# Patient Record
Sex: Female | Born: 1960 | Race: Black or African American | Hispanic: No | Marital: Single | Smoking: Never smoker
Health system: Southern US, Community
[De-identification: ages and names within clinical notes are randomized; demographics above are authoritative.]

## PROBLEM LIST (undated history)

## (undated) DIAGNOSIS — K219 Gastro-esophageal reflux disease without esophagitis: Secondary | ICD-10-CM

## (undated) DIAGNOSIS — Z862 Personal history of diseases of the blood and blood-forming organs and certain disorders involving the immune mechanism: Secondary | ICD-10-CM

---

## 1999-09-07 HISTORY — PX: ABDOMINAL HYSTERECTOMY: SHX81

## 2011-01-12 ENCOUNTER — Inpatient Hospital Stay (INDEPENDENT_AMBULATORY_CARE_PROVIDER_SITE_OTHER)
Admission: RE | Admit: 2011-01-12 | Discharge: 2011-01-12 | Disposition: A | Discharge status: Home / Self Care | Payer: Self-pay | Source: Ambulatory Visit | Attending: Family Medicine | Admitting: Family Medicine

## 2011-01-12 ENCOUNTER — Inpatient Hospital Stay (HOSPITAL_COMMUNITY)
Admission: EM | Admit: 2011-01-12 | Discharge: 2011-01-13 | DRG: 313 | Disposition: A | Discharge status: Home / Self Care | Payer: Non-veteran care | Attending: Internal Medicine | Admitting: Internal Medicine

## 2011-01-12 ENCOUNTER — Emergency Department (HOSPITAL_COMMUNITY): Payer: Non-veteran care

## 2011-01-12 DIAGNOSIS — I10 Essential (primary) hypertension: Secondary | ICD-10-CM | POA: Diagnosis present

## 2011-01-12 DIAGNOSIS — K219 Gastro-esophageal reflux disease without esophagitis: Secondary | ICD-10-CM | POA: Diagnosis present

## 2011-01-12 DIAGNOSIS — D869 Sarcoidosis, unspecified: Secondary | ICD-10-CM | POA: Diagnosis present

## 2011-01-12 DIAGNOSIS — M940 Chondrocostal junction syndrome [Tietze]: Secondary | ICD-10-CM | POA: Diagnosis present

## 2011-01-12 DIAGNOSIS — J45909 Unspecified asthma, uncomplicated: Secondary | ICD-10-CM | POA: Diagnosis present

## 2011-01-12 DIAGNOSIS — J9819 Other pulmonary collapse: Secondary | ICD-10-CM | POA: Diagnosis not present

## 2011-01-12 DIAGNOSIS — R0789 Other chest pain: Principal | ICD-10-CM | POA: Diagnosis present

## 2011-01-12 DIAGNOSIS — R079 Chest pain, unspecified: Secondary | ICD-10-CM

## 2011-01-12 DIAGNOSIS — Z79899 Other long term (current) drug therapy: Secondary | ICD-10-CM

## 2011-01-12 DIAGNOSIS — D649 Anemia, unspecified: Secondary | ICD-10-CM | POA: Diagnosis present

## 2011-01-12 HISTORY — DX: Gastro-esophageal reflux disease without esophagitis: K21.9

## 2011-01-12 HISTORY — DX: Personal history of diseases of the blood and blood-forming organs and certain disorders involving the immune mechanism: Z86.2

## 2011-01-12 LAB — CBC
Hemoglobin: 11.3 g/dL — ABNORMAL LOW (ref 12.0–15.0)
MCHC: 34.2 g/dL (ref 30.0–36.0)
RBC: 3.84 MIL/uL — ABNORMAL LOW (ref 3.87–5.11)
WBC: 5.6 10*3/uL (ref 4.0–10.5)

## 2011-01-12 LAB — POCT I-STAT, CHEM 8
HCT: 34 % — ABNORMAL LOW (ref 36.0–46.0)
Hemoglobin: 11.6 g/dL — ABNORMAL LOW (ref 12.0–15.0)
Potassium: 4 mEq/L (ref 3.5–5.1)
Sodium: 141 mEq/L (ref 135–145)

## 2011-01-12 LAB — POCT I-STAT TROPONIN I: Troponin i, poc: 0 ng/mL (ref 0.00–0.08)

## 2011-01-12 LAB — PHOSPHORUS: Phosphorus: 3 mg/dL (ref 2.3–4.6)

## 2011-01-12 LAB — MAGNESIUM: Magnesium: 2.1 mg/dL (ref 1.5–2.5)

## 2011-01-12 LAB — TROPONIN I: Troponin I: <0.3 ng/mL (ref <0.30)

## 2011-01-13 ENCOUNTER — Other Ambulatory Visit (HOSPITAL_COMMUNITY): Payer: Self-pay

## 2011-01-13 ENCOUNTER — Inpatient Hospital Stay (HOSPITAL_COMMUNITY): Payer: Non-veteran care

## 2011-01-13 ENCOUNTER — Encounter (HOSPITAL_COMMUNITY): Payer: Self-pay

## 2011-01-13 DIAGNOSIS — R079 Chest pain, unspecified: Secondary | ICD-10-CM

## 2011-01-13 LAB — CBC
MCH: 29.4 pg (ref 26.0–34.0)
Platelets: 202 10*3/uL (ref 150–400)
RBC: 3.54 MIL/uL — ABNORMAL LOW (ref 3.87–5.11)

## 2011-01-13 LAB — COMPREHENSIVE METABOLIC PANEL
ALT: 8 U/L (ref 0–35)
AST: 12 U/L (ref 0–37)
Albumin: 3.6 g/dL (ref 3.5–5.2)
CO2: 25 mEq/L (ref 19–32)
Calcium: 8.9 mg/dL (ref 8.4–10.5)
GFR calc non Af Amer: >60 mL/min (ref >60)
Sodium: 141 mEq/L (ref 135–145)
Total Protein: 6.9 g/dL (ref 6.0–8.3)

## 2011-01-13 LAB — LIPID PANEL
HDL: 46 mg/dL (ref >39)
LDL Cholesterol: 101 mg/dL — ABNORMAL HIGH (ref 0–99)
Total CHOL/HDL Ratio: 3.4 RATIO
VLDL: 10 mg/dL (ref 0–40)

## 2011-01-13 LAB — CARDIAC PANEL(CRET KIN+CKTOT+MB+TROPI)
Relative Index: 2.2 (ref 0.0–2.5)
Total CK: 105 U/L (ref 7–177)

## 2011-01-13 LAB — TSH
TSH: 0.676 u[IU]/mL (ref 0.350–4.500)
TSH: 0.58 u[IU]/mL (ref 0.350–4.500)

## 2011-01-13 LAB — VITAMIN B12: Vitamin B-12: 298 pg/mL (ref 211–911)

## 2011-01-13 LAB — HEMOGLOBIN A1C: Hgb A1c MFr Bld: 6.3 % — ABNORMAL HIGH (ref <5.7)

## 2011-01-13 LAB — SEDIMENTATION RATE: Sed Rate: 17 mm/hr (ref 0–22)

## 2011-01-13 LAB — IRON AND TIBC: UIBC: 208 ug/dL

## 2011-01-13 MED ORDER — IOHEXOL 300 MG/ML  SOLN
100.0000 mL | Freq: Once | INTRAMUSCULAR | Status: AC | PRN
  Administered 2011-01-13: 100 mL via INTRAVENOUS

## 2011-01-14 NOTE — Discharge Summary (Signed)
  NAMEWALESKA, BUTTERY NO.:  [id number]  MEDICAL RECORD NO.:  [id number]  LOCATION:  2919                         FACILITY:  [facility]  PHYSICIAN:  Conley Canal, MD      DATE OF BIRTH:  02-26-1961  DATE OF ADMISSION:  01/12/2011 DATE OF DISCHARGE:  01/13/2011                        DISCHARGE SUMMARY - REFERRING   CONSULTING PHYSICIANS:  Verne Carrow, MD with [facility]  PRIMARY CARE PHYSICIAN:  [facility] [place]  DISCHARGE DIAGNOSES: 1. Chest pain likely gastroesophageal reflux disease versus acute     costochondritis. 2. Dependent atelectasis. 3. Sarcoidosis. 4. Gastroesophageal reflux disease. 5. History of bronchial asthma. 6. Essential hypertension, stable. 7. Normocytic anemia requiring further workup.  We would defer workup     to the patient's primary care provider.  DISCHARGE MEDICATIONS: 1. Albuterol inhalations two puffs q.4 hourly as needed. 2. Artificial tears 1 drop in both eyes twice daily. 3. Black cohosh 1 capsule twice daily. 4. Chlorhexidine oral rinse as needed. 5. DHEA OTC 1 capsule daily. 6. Diclofenac 75 mg twice daily as needed. 7. Benadryl 25 mg at bedtime as needed. 8. Felodipine 5 mg daily. 9. Loratadine 10 mg daily. 10.Prilosec 20 mg daily. 11.Soy isoflavone OTC 1 capsule twice daily.  PROCEDURES PERFORMED: 1. CT of the chest without contrast on January 13, 2011 showed no     evidence of PE, but showed some dependent atelectasis and     borderline bilateral axillary lymph node. 2. Ultrasound of abdomen on January 13, 2011 showed small hemangioma     within the left hepatic lobe.  No evidence of cholelithiasis or     cholecystitis. 3. 2-D echocardiogram on January 13, 2011 showed EF of 55% to 60% with     normal wall motion and trivial mitral regurgitation.  HOSPITAL COURSE:  Ms. Taitt is a pleasant 50 year old female admitted on January 12, 2011 with complaints of chest pain which was worse with exertion.  There was  concern for acute coronary syndrome.  However, serial cardiac enzymes were negative x3 and 2-D echocardiogram was unrevealing.  She had a CT of the chest which did not show PE, but some small bilateral axillary lymph node.  The patient was seen by Cardiology, Dr. Clifton James who felt that the patient could have stress test in the outpatient setting as he felt the pain was noncardiac in origin.  The patient is now chest pain free, likely she had gastroesophageal reflux disease and may need EGD if stress test is negative.  She should follow with her primary care provider at the [facility] for further workup or she could have [facility] perform the stress test.  Essential hypertension, stable and normocytic anemia requiring further workup.  We would defer workup to the patient's primary care provider.  The time spent for this discharge preparation is less than 30 minutes.     Conley Canal, MD     SR/MEDQ  D:  01/13/2011  T:  01/13/2011  Job:  [id number]  cc:   Verne Carrow, MD  Electronically Signed by Conley Canal  on 01/14/2011 10:12:05 PM

## 2011-02-09 NOTE — H&P (Signed)
[name], [name] NO.:  [id number]  MEDICAL RECORD NO.:  [id number]  LOCATION:  [facility]                         FACILITY:  [facility]  PHYSICIAN:  Richarda Overlie, MD       DATE OF BIRTH:  1960/07/05  DATE OF ADMISSION:  01/12/2011 DATE OF DISCHARGE:                             HISTORY & PHYSICAL   PRIMARY CARE PHYSICIAN:  Unassigned.  CHIEF COMPLAINT:  Chest pain.  SUBJECTIVE:  A 50 year old female who presents to the ER with a chief complaint of substernal chest pain.  The patient has had these symptoms for about a week and a half associated with nausea, shortness of breath, and dizziness, mostly experiencing when she is riding her bike.  The patient states that she has a history of sarcoidosis and has experienced similar chest pain, but the pain was always nonradiating and limited to her chest.  She also has a pleuritic component to the pain and the pain is worse with inspiration and when she is lying down.  The patient has also experienced radiation of the pain into the left upper extremity. She has also experienced some left leg pain for the last 1-1/2 weeks. She denies any fever, chills, rigors, or cough.  She denies any recent history of travel, any sick contact.  She is a nonsmoker.  There is no history of DVT or PE.  Denies any palpitations, any syncopal or near- syncopal episodes.  PAST MEDICAL HISTORY:  History of sarcoidosis, GERD, asthma.  PAST SURGICAL HISTORY:  None.  SOCIAL HISTORY:  No history of drug abuse.  No history of smoking or alcohol use.  FAMILY HISTORY:  Father has had an angioplasty done and also has had a stroke in 12-Nov-2004.  Mother died of lupus in 11/12/01.  HOME MEDICATIONS:  DHEA, black cohosh, soy isoflavone, diclofenac, chlorhexidine, albuterol, omeprazole, loratadine, and felodipine.  ALLERGIES:  To NORVASC, CODEINE, and SULFA.  REVIEW OF SYSTEMS:  Complete review of systems was done as documented in HPI.  PHYSICAL  EXAMINATION:  VITAL SIGNS:  Blood pressure 113/79, pulse of 65, respirations 15, temperature 99.1. GENERAL:  Comfortable, in no acute cardiopulmonary distress. HEENT:  Pupils equal and reactive.  Extraocular movements intact. NECK:  Supple.  No JVD. LUNGS:  Clear to auscultation bilaterally.  No wheezes, crackles, or rhonchi. CARDIOVASCULAR:  Regular rate and rhythm.  No murmurs, rubs, or gallops. ABDOMEN:  Obese, soft, nontender, nondistended. EXTREMITIES:  Without cyanosis, clubbing, or edema. NEUROLOGIC:  Cranial nerves II through XII grossly intact. PSYCHIATRIC:  Appropriate mood and affect.  LABORATORY DATA:  EKG shows normal sinus rhythm.  Normal ST-T wave.  WBC 5.6, hemoglobin 11.3, hematocrit 33.0, and a platelet count of 198. Sodium 141, potassium 4.0, chloride 106, BUN 16, creatinine 0.8, glucose 93.  Chest x-ray negative.  ASSESSMENT AND PLAN:  A 50 year old female with sort of atypical symptoms of chest pain.  The patient is fairly active and exercises 3-4 days a week with no associated symptoms, otherwise, but she has started experiencing this for the last 1-1/2 weeks.  Because of history of sarcoidosis and a family history of lupus, I think it is imperative to rule  out a pulmonary embolism.  We will obtain a D-dimer.  If positive, we will do a CT angio of the chest to rule out PE.  We are going to cycle cardiac enzymes, maintain her on telemetry, obtain a 2D echo to rule out wall motion abnormalities.  We will also do a right upper quadrant ultrasound since she does have some right upper quadrant pain associated with some nausea, keep her n.p.o. past midnight for the ultrasound.  She is a full code.  If the patient's workup is essentially negative, then we could consider doing Lexiscan Myoview prior to her discharge.     Richarda Overlie, MD     NA/MEDQ  D:  01/12/2011  T:  01/12/2011  Job:  [id number]  Electronically Signed by Richarda Overlie MD on 02/09/2011 07:30:41  PM

## 2011-02-21 NOTE — Consult Note (Signed)
NAMEMarland Tanner  LARAY, RIVKIN.:  [id number]  MEDICAL RECORD NO.:  [id number]  LOCATION:                                 FACILITY:  PHYSICIAN:  Marca Ancona, MD      DATE OF BIRTH:  1960/11/18  DATE OF CONSULTATION: DATE OF DISCHARGE:                                CONSULTATION   PRIMARY CARE PHYSICIAN:  [place], [place].  HISTORY OF PRESENT ILLNESS:  This is a 50 year old with history of sarcoidosis and chronic chest pain who presented to the hospital with chest pain.  The patient has had chest pain for years.  She takes diclofenac and has had TENS unit.  In the past, the pain has been constant (waxing/waning) and on the left side.  Over the last 1-2 weeks, the chest pain seems to have migrated to her central chest which is unusual for her.  It is still essentially constant.  It still waxes and wanes.  Her chest wall is tender.  The pain is not pleuritic.  The pain is not exertional.  The patient has noted some exertional dyspnea.  She has been mildly short of breath when riding her bicycle.  In general, she is active.  She came to the Urgent Care here at [facility] because of the different location of the chest pain and she was kept in the hospital.  So far, here her cardiac enzymes has been negative x3.  Her EKG is normal and her echo is normal.  MEDICATIONS: 1. Aspirin 81 mg daily. 2. Lovenox 40 mg daily. 3. Felodipine 5 mg daily. 4. Protonix 40 mg daily.  PAST MEDICAL HISTORY: 1. Gastroesophageal reflux disease. 2. Asthma. 3. Sarcoidosis with chronic chest pain. 4. Hypertension. 5. Echocardiogram done this hospitalization shows EF 55-60%, normal     diastolic parameters, and trivial mitral regurgitation.  SOCIAL HISTORY:  The patient is married.  She lives in [place].  She does not smoke, drink alcohol, or use any drugs.  FAMILY HISTORY:  Mother had lupus.  Father had coronary artery disease and has had a stroke.  REVIEW OF SYSTEMS:  All systems  were reviewed and were negative except as noted in the history of present illness.  PHYSICAL EXAMINATION:  VITAL SIGNS:  Temperature 98.3, pulse is in the 70s and regular, blood pressure 136/64, and oxygen saturation 99% on room air. GENERAL:  This is a well-developed female in no apparent distress. NEUROLOGIC:  Alert and oriented x3.  Normal affect. HEENT:  Normal. ABDOMEN:  Soft and nontender.  No hepatosplenomegaly.  Normal bowel sounds. NECK:  There is no thyromegaly or thyroid nodule.  There is no JVD. CARDIOVASCULAR:  Heart regular S1 and S2.  No S3, no S4.  There is no murmur.  There is no carotid bruit.  There is no peripheral edema. LUNGS:  Clear to auscultation bilaterally with normal respiratory effort. MUSCULOSKELETAL:  The chest wall is tender to palpation. SKIN:  Normal exam.  RADIOLOGY:  CT angiogram of the chest shows no PE.  EKG shows normal sinus rhythm, it is normal EKG.  LABORATORY DATA:  Hematocrit 30.4, potassium 3.5, creatinine 0.68.  Pro- BNP is  10.2.  TSH is normal.  D-dimer is negative.  Cardiac enzymes negative x3.  Sed rate is 17.  IMPRESSION:  This is a 50 year old with history of sarcoidosis with chronic chest pain who presented with chest pain with different features compared to her prior chronic chest pain.  Her chest wall is tender. Her cardiac enzymes are negative.  Her echo is normal.  Her EKG is normal.  CTA of the chest showed no PE.  I do suspect that her chest pain is noncardiac.  I think it is reasonable to get an outpatient stress test.  She can do this at the [place] or we can arrange for one.     Marca Ancona, MD     DM/MEDQ  D:  01/13/2011  T:  01/14/2011  Job:  [id number]  Electronically Signed by Marca Ancona MD on 02/21/2011 10:38:55 PM

## 2012-03-15 ENCOUNTER — Emergency Department (HOSPITAL_COMMUNITY): Payer: Non-veteran care

## 2012-03-15 ENCOUNTER — Emergency Department (HOSPITAL_COMMUNITY)
Admission: EM | Admit: 2012-03-15 | Discharge: 2012-03-15 | Disposition: A | Discharge status: Home / Self Care | Payer: Non-veteran care | Attending: Emergency Medicine | Admitting: Emergency Medicine

## 2012-03-15 ENCOUNTER — Encounter (HOSPITAL_COMMUNITY): Payer: Self-pay | Admitting: Physical Medicine and Rehabilitation

## 2012-03-15 DIAGNOSIS — J45909 Unspecified asthma, uncomplicated: Secondary | ICD-10-CM | POA: Insufficient documentation

## 2012-03-15 DIAGNOSIS — R0789 Other chest pain: Secondary | ICD-10-CM | POA: Insufficient documentation

## 2012-03-15 DIAGNOSIS — R350 Frequency of micturition: Secondary | ICD-10-CM | POA: Insufficient documentation

## 2012-03-15 DIAGNOSIS — K219 Gastro-esophageal reflux disease without esophagitis: Secondary | ICD-10-CM | POA: Insufficient documentation

## 2012-03-15 LAB — POCT I-STAT TROPONIN I

## 2012-03-15 LAB — CBC
HCT: 36.6 % (ref 36.0–46.0)
Hemoglobin: 12.2 g/dL (ref 12.0–15.0)
MCV: 86.5 fL (ref 78.0–100.0)
RBC: 4.23 MIL/uL (ref 3.87–5.11)
WBC: 5 10*3/uL (ref 4.0–10.5)

## 2012-03-15 LAB — POCT I-STAT, CHEM 8
Chloride: 106 mEq/L (ref 96–112)
Creatinine, Ser: 0.9 mg/dL (ref 0.50–1.10)
Glucose, Bld: 100 mg/dL — ABNORMAL HIGH (ref 70–99)
Hemoglobin: 12.9 g/dL (ref 12.0–15.0)
Potassium: 3.9 mEq/L (ref 3.5–5.1)
Sodium: 142 mEq/L (ref 135–145)

## 2012-03-15 MED ORDER — HYDROCODONE-ACETAMINOPHEN 5-325 MG PO TABS
1.0000 | ORAL_TABLET | Freq: Once | ORAL | Status: AC
  Administered 2012-03-15: 1 via ORAL
  Filled 2012-03-15: qty 1

## 2012-03-15 MED ORDER — HYDROCODONE-ACETAMINOPHEN 5-325 MG PO TABS: 1.0000 | ORAL_TABLET | ORAL | Status: DC | PRN

## 2012-03-15 MED ORDER — NITROGLYCERIN 0.4 MG SL SUBL
0.4000 mg | SUBLINGUAL_TABLET | SUBLINGUAL | Status: DC | PRN
  Administered 2012-03-15: 0.4 mg via SUBLINGUAL

## 2012-03-15 MED ORDER — ASPIRIN 325 MG PO TABS: 325.0000 mg | ORAL_TABLET | ORAL | Status: DC

## 2012-03-15 MED ORDER — LABETALOL HCL 5 MG/ML IV SOLN: 20.0000 mg | Freq: Once | INTRAVENOUS | Status: DC

## 2012-03-15 NOTE — ED Provider Notes (Cosign Needed)
History     CSN: [id number]  Arrival date & time 03/15/12  4098   First MD Initiated Contact with Patient 03/15/12 [phone]      Chief Complaint  Patient presents with  . Chest Pain    (Consider location/radiation/quality/duration/timing/severity/associated sxs/prior treatment) HPI Comments: Is a 51 year old woman who complains of chest pain. She has a history of sarcoidosis and chronic chest pain. This morning around 7 the pain started, and was felt behind her left breast and radiated through to the back. She rated the pain as an 8. She took aspirin 81 mg without relief. She therefore sought evaluation, because the pain was worse than usual. She's had prior evaluation for chest pain in August of 2012, and her pain was felt to be noncardiac at that time. She has subsequently been treated for her pain at the [facility] by a pain medicine doctor who has given her injections for thoracic chest pain.  Patient is a 51 y.o. female presenting with chest pain. The history is provided by the patient and medical records. No language interpreter was used.  Chest Pain The chest pain began less than 1 hour ago. Duration of episode(s) is 1 hour. Chest pain occurs constantly. The chest pain is unchanged. The pain is associated with breathing. At its most intense, the pain is at 8/10. The pain is currently at 8/10. The severity of the pain is moderate. The quality of the pain is described as pleuritic and aching. The pain radiates to the upper back. Chest pain is worsened by deep breathing. Primary symptoms include cough. Pertinent negatives for primary symptoms include no fever, no shortness of breath, no palpitations, no nausea and no altered mental status. She tried aspirin for the symptoms. Risk factors include no known risk factors (Prior Hx sarcoidosis and of noncardiac chest pain.).  Her past medical history is significant for hypertension. Past medical history comments: Sarcoidosis, depression     Past  Medical History  Diagnosis Date  . Asthma   . History of sarcoidosis t  . GERD (gastroesophageal reflux disease) t    No past surgical history on file.  No family history on file.  History  Substance Use Topics  . Smoking status: Current Every Day Smoker    Types: Cigarettes  . Smokeless tobacco: Not on file  . Alcohol Use: No    OB History    Grav Para Term Preterm Abortions TAB SAB Ect Mult Living                  Review of Systems  Constitutional: Negative for fever.       Subjective fever in past 2 weeks.  HENT: Negative.   Eyes: Negative.   Respiratory: Positive for cough. Negative for shortness of breath.   Cardiovascular: Positive for chest pain. Negative for palpitations.  Gastrointestinal: Negative.  Negative for nausea.  Genitourinary: Positive for frequency.  Skin: Negative.   Neurological: Negative.   Psychiatric/Behavioral: Negative.  Negative for altered mental status.    Allergies  Norvasc; Sulfa antibiotics; and Codeine  Home Medications  No current outpatient prescriptions on file.  BP 122/76  Pulse 86  Temp 98.5 F (36.9 C) (Oral)  Resp 22  SpO2 100%  Physical Exam  Nursing note and vitals reviewed. Constitutional: She is oriented to person, place, and time. She appears well-developed and well-nourished.       Mild distress with left chest pain.  HENT:  Head: Normocephalic and atraumatic.  Right Ear: External ear  normal.  Left Ear: External ear normal.  Mouth/Throat: Oropharynx is clear and moist.       Braces on teeth.  Eyes: Conjunctivae normal and EOM are normal. Pupils are equal, round, and reactive to light.  Neck: Normal range of motion. Neck supple.  Cardiovascular: Normal rate, regular rhythm and normal heart sounds.   Pulmonary/Chest: Effort normal and breath sounds normal.       No friction rub, but pt notes pain worse when takes a deep breath.  Abdominal: Soft. Bowel sounds are normal.  Musculoskeletal: Normal range of  motion.  Neurological: She is alert and oriented to person, place, and time.       No sensory or motor deficit.  Skin: Skin is warm and dry.  Psychiatric: She has a normal mood and affect. Her behavior is normal.    ED Course  Procedures (including critical care time)  8:13 AM  Date: 03/15/2012  Rate: 96  Rhythm: normal sinus rhythm  QRS Axis: normal  Intervals: normal  ST/T Wave abnormalities: nonspecific T wave changes  Conduction Disutrbances:none  Narrative Interpretation: Abnormal EKG  Old EKG Reviewed: changes noted--has nonspecific T wave changes, not present on EKG of 01/13/2011.  8:55 AM Pt seen --> physical exam performed.  Lab workup ordered.  PO hydrocodone-acetaminophen for pain.  11:56 AM Results for orders placed during the hospital encounter of 03/15/12  CBC      Component Value Range   WBC 5.0  4.0 - 10.5 K/uL   RBC 4.23  3.87 - 5.11 MIL/uL   Hemoglobin 12.2  12.0 - 15.0 g/dL   HCT 16.1  09.6 - 04.5 %   MCV 86.5  78.0 - 100.0 fL   MCH 28.8  26.0 - 34.0 pg   MCHC 33.3  30.0 - 36.0 g/dL   RDW 40.9  81.1 - 91.4 %   Platelets 237  150 - 400 K/uL  D-DIMER, QUANTITATIVE      Component Value Range   D-Dimer, Quant <0.27  0.00 - 0.48 ug/mL-FEU  POCT I-STAT, CHEM 8      Component Value Range   Sodium 142  135 - 145 mEq/L   Potassium 3.9  3.5 - 5.1 mEq/L   Chloride 106  96 - 112 mEq/L   BUN 14  6 - 23 mg/dL   Creatinine, Ser 7.82  0.50 - 1.10 mg/dL   Glucose, Bld 956 (*) 70 - 99 mg/dL   Calcium, Ion 2.13  0.86 - 1.23 mmol/L   TCO2 24  0 - 100 mmol/L   Hemoglobin 12.9  12.0 - 15.0 g/dL   HCT 57.8  46.9 - 62.9 %  POCT I-STAT TROPONIN I      Component Value Range   Troponin i, poc 0.00  0.00 - 0.08 ng/mL   Comment 3            Dg Chest Port 1 View  03/15/2012  *RADIOLOGY REPORT*  Clinical Data: Chest pain, history hypertension, sarcoidosis  PORTABLE CHEST - 1 VIEW  Comparison: Portable exam 0859 hours compared to 01/12/2011  Findings: Normal heart size,  mediastinal contours, and pulmonary vascularity. Lungs clear. No pleural effusion or pneumothorax. Cardiac monitoring leads project over chest.  IMPRESSION: No acute abnormalities.   Original Report Authenticated By: Lollie Marrow, M.D.     Lab workup is negative.  No serious cause of chest pain was found.  Pt released, with prescription for hydrocodone-acetaminophen q4h prn pain. F/U with [facility].  May need  additional injection for her chronic, recurrent chest pain.  1. Atypical chest pain          Carleene Cooper III, MD 03/15/12 1159

## 2012-03-15 NOTE — ED Notes (Signed)
Pt presents to department for evaluation of L sided chest pain. Ongoing for several years, states pain became worse x2 weeks ago. 9/10 pain at the time, increases with movement and deep breathing. Respirations unlabored. Skin warm and dry. No signs of acute distress noted at present.

## 2012-05-06 ENCOUNTER — Encounter (HOSPITAL_BASED_OUTPATIENT_CLINIC_OR_DEPARTMENT_OTHER): Payer: Non-veteran care

## 2012-05-09 ENCOUNTER — Ambulatory Visit (HOSPITAL_BASED_OUTPATIENT_CLINIC_OR_DEPARTMENT_OTHER): Payer: Non-veteran care | Attending: Internal Medicine

## 2012-05-09 VITALS — Ht 66.5 in | Wt 170.0 lb

## 2012-05-09 DIAGNOSIS — R259 Unspecified abnormal involuntary movements: Secondary | ICD-10-CM | POA: Insufficient documentation

## 2012-05-09 DIAGNOSIS — G4733 Obstructive sleep apnea (adult) (pediatric): Secondary | ICD-10-CM

## 2012-05-09 DIAGNOSIS — G4730 Sleep apnea, unspecified: Secondary | ICD-10-CM | POA: Insufficient documentation

## 2012-05-09 DIAGNOSIS — G4710 Hypersomnia, unspecified: Secondary | ICD-10-CM | POA: Insufficient documentation

## 2012-05-14 DIAGNOSIS — G4710 Hypersomnia, unspecified: Secondary | ICD-10-CM

## 2012-05-14 DIAGNOSIS — R0989 Other specified symptoms and signs involving the circulatory and respiratory systems: Secondary | ICD-10-CM

## 2012-05-14 DIAGNOSIS — M62838 Other muscle spasm: Secondary | ICD-10-CM

## 2012-05-14 DIAGNOSIS — G4730 Sleep apnea, unspecified: Secondary | ICD-10-CM

## 2012-05-14 DIAGNOSIS — R0609 Other forms of dyspnea: Secondary | ICD-10-CM

## 2012-05-14 NOTE — Procedures (Signed)
NAMEJAPJI, KOK NO.:  [id number]  MEDICAL RECORD NO.:  [id number]          PATIENT TYPE:  OUT  LOCATION:  SLEEP CENTER                 FACILITY[facility]  [facility]  PHYSICIAN:  Karolyne Timmons D. Maple Hudson, MD, FCCP, FACPDATE OF BIRTH:  14-Jun-1960  DATE OF STUDY:  05/09/2012                           NOCTURNAL POLYSOMNOGRAM  REFERRING PHYSICIAN:  Caris Cerveny D. Maple Hudson, MD, FCCP, FACP  REFERRING PHYSICIAN:  Dr. Verlin Fester.  INDICATION FOR STUDY:  Hypersomnia with sleep apnea.  EPWORTH SLEEPINESS SCORE:  16/24.  BMI 27, weight 170 pounds, height 66.5 inches, neck 14.5 inches.  MEDICATIONS:  Home medications are charted and reviewed.  SLEEP ARCHITECTURE:  Total sleep time 306.5 minutes with sleep efficiency 84.4%.  Stage I was 9.8%, stage II 74.6%.  Stage III absent, REM 15.7% of total sleep time.  Sleep latency 32.5 minutes.  REM latency 106.5 minutes.  Awake after sleep onset 23 minutes.  Arousal index 9.8.  RESPIRATORY DATA:  Apnea/hypopnea index (AHI) 1.8 per hour.  A total of 9 events was scored, all was hypopneas while supine.  REM AHI 7.5 per hour.  There were insufficient numbers of events to permit application of split protocol, CPAP titration on this study night.  OXYGEN DATA:  Moderate snoring with oxygen desaturation to a nadir of 92% and mean oxygen saturation through the study of 96.2% on room air.  CARDIAC DATA:  Normal sinus rhythm.  MOVEMENT/PARASOMNIA:  A total of 117 limb jerks were counted, of which 5 were associated with arousal or awakening for periodic limb movement with arousal index of 1 per hour.  No bathroom trips.  IMPRESSION/RECOMMENDATION: 1. Unremarkable sleep architecture for sleep center environment     without bedtime medication.  Several brief spontaneous wakings were     noted. 2. Occasional respiratory event with sleep disturbance, within normal     limits.  AHI 1.8 per hour (the normal range for adults is from 0-5     events per  hour).  Moderate snoring with oxygen desaturation to a     nadir of 92% and mean oxygen saturation through the study of 96.2%     on room air. 3. Frequent limb jerks.  A total of 117 limb jerks were counted, of     which 5 were associated with arousal or     awakening for periodic limb movement with arousal index of 1 per     hour which is of doubtful clinical significance.     Erle Guster D. Maple Hudson, MD, [facility], FACP Diplomate, American Board of Sleep Medicine    CDY/MEDQ  D:  05/14/2012 11:47:46  T:  05/14/2012 17:48:51  Job:  [id number]

## 2015-11-11 ENCOUNTER — Encounter (HOSPITAL_COMMUNITY): Payer: Self-pay | Admitting: Emergency Medicine

## 2015-11-11 ENCOUNTER — Emergency Department (HOSPITAL_COMMUNITY): Payer: Non-veteran care

## 2015-11-11 ENCOUNTER — Emergency Department (HOSPITAL_COMMUNITY)
Admission: EM | Admit: 2015-11-11 | Discharge: 2015-11-11 | Disposition: A | Discharge status: Home / Self Care | Payer: Non-veteran care | Attending: Emergency Medicine | Admitting: Emergency Medicine

## 2015-11-11 DIAGNOSIS — F17210 Nicotine dependence, cigarettes, uncomplicated: Secondary | ICD-10-CM | POA: Diagnosis not present

## 2015-11-11 DIAGNOSIS — J45909 Unspecified asthma, uncomplicated: Secondary | ICD-10-CM | POA: Insufficient documentation

## 2015-11-11 DIAGNOSIS — Z862 Personal history of diseases of the blood and blood-forming organs and certain disorders involving the immune mechanism: Secondary | ICD-10-CM | POA: Insufficient documentation

## 2015-11-11 DIAGNOSIS — Z79899 Other long term (current) drug therapy: Secondary | ICD-10-CM | POA: Insufficient documentation

## 2015-11-11 DIAGNOSIS — R079 Chest pain, unspecified: Secondary | ICD-10-CM | POA: Insufficient documentation

## 2015-11-11 DIAGNOSIS — K219 Gastro-esophageal reflux disease without esophagitis: Secondary | ICD-10-CM | POA: Diagnosis not present

## 2015-11-11 LAB — COMPREHENSIVE METABOLIC PANEL
ALT: 17 U/L (ref 14–54)
ANION GAP: 4 — AB (ref 5–15)
AST: 23 U/L (ref 15–41)
Albumin: 4.1 g/dL (ref 3.5–5.0)
Alkaline Phosphatase: 90 U/L (ref 38–126)
BUN: 16 mg/dL (ref 6–20)
CHLORIDE: 111 mmol/L (ref 101–111)
CO2: 21 mmol/L — ABNORMAL LOW (ref 22–32)
Calcium: 9.6 mg/dL (ref 8.9–10.3)
Creatinine, Ser: 0.67 mg/dL (ref 0.44–1.00)
GFR calc Af Amer: >60 mL/min (ref >60)
Glucose, Bld: 115 mg/dL — ABNORMAL HIGH (ref 65–99)
POTASSIUM: 4.3 mmol/L (ref 3.5–5.1)
Sodium: 136 mmol/L (ref 135–145)
Total Bilirubin: 0.3 mg/dL (ref 0.3–1.2)
Total Protein: 7.2 g/dL (ref 6.5–8.1)

## 2015-11-11 LAB — URINALYSIS, ROUTINE W REFLEX MICROSCOPIC
Bilirubin Urine: NEGATIVE
Glucose, UA: NEGATIVE mg/dL
HGB URINE DIPSTICK: NEGATIVE
Ketones, ur: NEGATIVE mg/dL
LEUKOCYTES UA: NEGATIVE
Nitrite: NEGATIVE
Protein, ur: NEGATIVE mg/dL
SPECIFIC GRAVITY, URINE: 1.019 (ref 1.005–1.030)
pH: 5.5 (ref 5.0–8.0)

## 2015-11-11 LAB — CBC
HCT: 35.3 % — ABNORMAL LOW (ref 36.0–46.0)
HEMOGLOBIN: 11.4 g/dL — AB (ref 12.0–15.0)
MCH: 27.7 pg (ref 26.0–34.0)
MCHC: 32.3 g/dL (ref 30.0–36.0)
MCV: 85.9 fL (ref 78.0–100.0)
Platelets: 231 10*3/uL (ref 150–400)
RBC: 4.11 MIL/uL (ref 3.87–5.11)
RDW: 13.7 % (ref 11.5–15.5)
WBC: 4.4 10*3/uL (ref 4.0–10.5)

## 2015-11-11 LAB — I-STAT TROPONIN, ED: TROPONIN I, POC: 0 ng/mL (ref 0.00–0.08)

## 2015-11-11 MED ORDER — IOPAMIDOL (ISOVUE-370) INJECTION 76%
INTRAVENOUS | Status: AC
  Administered 2015-11-11: 100 mL
  Filled 2015-11-11: qty 100

## 2015-11-11 MED ORDER — GI COCKTAIL ~~LOC~~
30.0000 mL | Freq: Once | ORAL | Status: AC
  Administered 2015-11-11: 30 mL via ORAL
  Filled 2015-11-11: qty 30

## 2015-11-11 NOTE — Discharge Instructions (Signed)
Nonspecific Chest Pain  °Chest pain can be caused by many different conditions. There is always a chance that your pain could be related to something serious, such as a heart attack or a blood clot in your lungs. Chest pain can also be caused by conditions that are not life-threatening. If you have chest pain, it is very important to follow up with your health care provider. °CAUSES  °Chest pain can be caused by: °· Heartburn. °· Pneumonia or bronchitis. °· Anxiety or stress. °· Inflammation around your heart (pericarditis) or lung (pleuritis or pleurisy). °· A blood clot in your lung. °· A collapsed lung (pneumothorax). It can develop suddenly on its own (spontaneous pneumothorax) or from trauma to the chest. °· Shingles infection (varicella-zoster virus). °· Heart attack. °· Damage to the bones, muscles, and cartilage that make up your chest wall. This can include: °¨ Bruised bones due to injury. °¨ Strained muscles or cartilage due to frequent or repeated coughing or overwork. °¨ Fracture to one or more ribs. °¨ Sore cartilage due to inflammation (costochondritis). °RISK FACTORS  °Risk factors for chest pain may include: °· Activities that increase your risk for trauma or injury to your chest. °· Respiratory infections or conditions that cause frequent coughing. °· Medical conditions or overeating that can cause heartburn. °· Heart disease or family history of heart disease. °· Conditions or health behaviors that increase your risk of developing a blood clot. °· Having had chicken pox (varicella zoster). °SIGNS AND SYMPTOMS °Chest pain can feel like: °· Burning or tingling on the surface of your chest or deep in your chest. °· Crushing, pressure, aching, or squeezing pain. °· Dull or sharp pain that is worse when you move, cough, or take a deep breath. °· Pain that is also felt in your back, neck, shoulder, or arm, or pain that spreads to any of these areas. °Your chest pain may come and go, or it may stay  constant. °DIAGNOSIS °Lab tests or other studies may be needed to find the cause of your pain. Your health care provider may have you take a test called an ambulatory ECG (electrocardiogram). An ECG records your heartbeat patterns at the time the test is performed. You may also have other tests, such as: °· Transthoracic echocardiogram (TTE). During echocardiography, sound waves are used to create a picture of all of the heart structures and to look at how blood flows through your heart. °· Transesophageal echocardiogram (TEE). This is a more advanced imaging test that obtains images from inside your body. It allows your health care provider to see your heart in finer detail. °· Cardiac monitoring. This allows your health care provider to monitor your heart rate and rhythm in real time. °· Holter monitor. This is a portable device that records your heartbeat and can help to diagnose abnormal heartbeats. It allows your health care provider to track your heart activity for several days, if needed. °· Stress tests. These can be done through exercise or by taking medicine that makes your heart beat more quickly. °· Blood tests. °· Imaging tests. °TREATMENT  °Your treatment depends on what is causing your chest pain. Treatment may include: °· Medicines. These may include: °¨ Acid blockers for heartburn. °¨ Anti-inflammatory medicine. °¨ Pain medicine for inflammatory conditions. °¨ Antibiotic medicine, if an infection is present. °¨ Medicines to dissolve blood clots. °¨ Medicines to treat coronary artery disease. °· Supportive care for conditions that do not require medicines. This may include: °¨ Resting. °¨ Applying heat   or cold packs to injured areas. °¨ Limiting activities until pain decreases. °HOME CARE INSTRUCTIONS °· If you were prescribed an antibiotic medicine, finish it all even if you start to feel better. °· Avoid any activities that bring on chest pain. °· Do not use any tobacco products, including  cigarettes, chewing tobacco, or electronic cigarettes. If you need help quitting, ask your health care provider. °· Do not drink alcohol. °· Take medicines only as directed by your health care provider. °· Keep all follow-up visits as directed by your health care provider. This is important. This includes any further testing if your chest pain does not go away. °· If heartburn is the cause for your chest pain, you may be told to keep your head raised (elevated) while sleeping. This reduces the chance that acid will go from your stomach into your esophagus. °· Make lifestyle changes as directed by your health care provider. These may include: °¨ Getting regular exercise. Ask your health care provider to suggest some activities that are safe for you. °¨ Eating a heart-healthy diet. A registered dietitian can help you to learn healthy eating options. °¨ Maintaining a healthy weight. °¨ Managing diabetes, if necessary. °¨ Reducing stress. °SEEK MEDICAL CARE IF: °· Your chest pain does not go away after treatment. °· You have a rash with blisters on your chest. °· You have a fever. °SEEK IMMEDIATE MEDICAL CARE IF:  °· Your chest pain is worse. °· You have an increasing cough, or you cough up blood. °· You have severe abdominal pain. °· You have severe weakness. °· You faint. °· You have chills. °· You have sudden, unexplained chest discomfort. °· You have sudden, unexplained discomfort in your arms, back, neck, or jaw. °· You have shortness of breath at any time. °· You suddenly start to sweat, or your skin gets clammy. °· You feel nauseous or you vomit. °· You suddenly feel light-headed or dizzy. °· Your heart begins to beat quickly, or it feels like it is skipping beats. °These symptoms may represent a serious problem that is an emergency. Do not wait to see if the symptoms will go away. Get medical help right away. Call your local emergency services (911 in the [place] Do not drive yourself to the hospital. °  °This  information is not intended to replace advice given to you by your health care provider. Make sure you discuss any questions you have with your health care provider. °  °Document Released: 03/04/2005 Document Revised: 06/15/2014 Document Reviewed: 12/29/2013 °Elsevier Interactive Patient Education ©2016 [employer] ° °

## 2015-11-11 NOTE — ED Notes (Signed)
Pt verbalized understanding of discharge instructions and follow up care

## 2015-11-11 NOTE — ED Notes (Addendum)
For  2 weeks  has cp that comes and goes, when she  lays down it feels worse, sob  And small amt nausea, has had some back pain also she states  Has taken  Her  Heartburn meds but it has not helped has had weight gain and  And profuse sweating

## 2015-11-11 NOTE — ED Provider Notes (Signed)
CSN: [id number]     Arrival date & time 11/11/15  1550 History   First MD Initiated Contact with Patient 11/11/15 1812     Chief Complaint  Patient presents with  . Chest Pain    HPI   Nancy Tanner is an 55 y.o. female with history of sarcoidosis and GERD who presents to the ED for evaluation of chest pain. She states the pain has been present intermittently for th peast two weeks but states that for the past week it has been more persistent. She states it feels like heart burn that starts in her central substernal area and radiates around her left chest to her back. She states she has had intermittent diaphoresis. She denies associated SOB. States it is worse at times laying down and better sitting up. She states she has taken TUMS with no relief. She does note associated intemittent mild nausea but denies emesis or diarrhea. She states she has noticed some mild abdominal pain over the past two weeks but denies abdominal pain currently. She does endorse increased urinary frequency but denies dysuria or hematuria. Denies fever or chills. She does endorse family history of heart dz but has never been told she has heart issues of her own. She does admit to smoking <1/2 PPD.  Past Medical History  Diagnosis Date  . Asthma   . History of sarcoidosis t  . GERD (gastroesophageal reflux disease) t   History reviewed. No pertinent past surgical history. No family history on file. Social History  Substance Use Topics  . Smoking status: Current Every Day Smoker    Types: Cigarettes  . Smokeless tobacco: None  . Alcohol Use: No   OB History    No data available     Review of Systems  All other systems reviewed and are negative.     Allergies  Norvasc; Shellfish allergy; Sulfa antibiotics; Codeine; Eggs or egg-derived products; Lactose intolerance (gi); and Tape  Home Medications   Prior to Admission medications   Medication Sig Start Date End Date Taking? Authorizing Provider   acetaminophen (TYLENOL) 325 MG tablet Take 325-650 mg by mouth every 6 (six) hours as needed for mild pain, moderate pain or headache. For pain   Yes Historical Provider, MD  albuterol (PROVENTIL HFA;VENTOLIN HFA) 108 (90 BASE) MCG/ACT inhaler Inhale 2 puffs into the lungs every 6 (six) hours as needed. For shortness of breath   Yes Historical Provider, MD  azelastine (ASTELIN) 0.1 % nasal spray Place 2 sprays into both nostrils See admin instructions. Once to twice daily   Yes Historical Provider, MD  CAMPHOR-MENTHOL EX Apply 1 application topically daily as needed (for symptoms).   Yes Historical Provider, MD  cetirizine (ZYRTEC) 10 MG tablet Take 10 mg by mouth daily.   Yes Historical Provider, MD  CYCLOBENZAPRINE HCL PO Take 1 tablet by mouth every 6 (six) hours as needed (for muscle spasms).   Yes Historical Provider, MD  diphenhydrAMINE (BENADRYL) 12.5 MG/5ML liquid Take 25 mg by mouth daily as needed for itching or allergies.   Yes Historical Provider, MD  FLUoxetine (PROZAC) 10 MG tablet Take 30-40 mg by mouth daily.   Yes Historical Provider, MD  montelukast (SINGULAIR) 10 MG tablet Take 10 mg by mouth at bedtime.   Yes Historical Provider, MD  NIFEdipine (PROCARDIA-XL/ADALAT-CC/NIFEDICAL-XL) 30 MG 24 hr tablet Take 30 mg by mouth every morning.   Yes Historical Provider, MD  omeprazole (PRILOSEC) 20 MG capsule Take 20 mg by mouth daily.  Yes Historical Provider, MD  OVER THE COUNTER MEDICATION Take 1 tablet by mouth daily as needed. Kohosh tablet for hot flashes   Yes Historical Provider, MD  polyvinyl alcohol-povidone (HYPOTEARS) 1.4-0.6 % ophthalmic solution Place 1-2 drops into both eyes daily as needed (for dry eyes).    Yes Historical Provider, MD  HYDROcodone-acetaminophen (NORCO/VICODIN) 5-325 MG per tablet Take 1 tablet by mouth every 4 (four) hours as needed for pain. 03/15/12   Carleene Cooper, MD   BP 123/82 mmHg  Pulse 71  Temp(Src) 98.2 F (36.8 C) (Oral)  Resp 20  Wt  83.915 kg  SpO2 100% Physical Exam  Constitutional: She is oriented to person, place, and time.  HENT:  Right Ear: External ear normal.  Left Ear: External ear normal.  Nose: Nose normal.  Mouth/Throat: Oropharynx is clear and moist. No oropharyngeal exudate.  Eyes: Conjunctivae and EOM are normal. Pupils are equal, round, and reactive to light.  Neck: Normal range of motion. Neck supple.  Cardiovascular: Normal rate, regular rhythm, normal heart sounds and intact distal pulses.   Pulmonary/Chest: Effort normal and breath sounds normal. No respiratory distress. She has no wheezes. She exhibits no tenderness.  No increased WOB or tachypnea  Abdominal: Soft. Bowel sounds are normal. She exhibits no distension. There is no tenderness. There is no rebound and no guarding.  No CVA tenderness  Musculoskeletal: She exhibits no edema.  Neurological: She is alert and oriented to person, place, and time. No cranial nerve deficit.  Skin: Skin is warm and dry.  Psychiatric: She has a normal mood and affect.  Nursing note and vitals reviewed.   ED Course  Procedures (including critical care time) Labs Review Labs Reviewed  CBC - Abnormal; Notable for the following:    Hemoglobin 11.4 (*)    HCT 35.3 (*)    All other components within normal limits  COMPREHENSIVE METABOLIC PANEL - Abnormal; Notable for the following:    CO2 21 (*)    Glucose, Bld 115 (*)    Anion gap 4 (*)    All other components within normal limits  URINALYSIS, ROUTINE W REFLEX MICROSCOPIC (NOT AT [facility])  Rosezena Sensor, ED    Imaging Review Dg Chest 2 View  11/11/2015  CLINICAL DATA:  Intermittent chest pain for 2 weeks. EXAM: CHEST  2 VIEW COMPARISON:  03/15/2012. FINDINGS: The heart size and mediastinal contours are within normal limits. Both lungs are clear. The visualized skeletal structures are unremarkable. IMPRESSION: Normal chest x-ray. Electronically Signed   By: Rudie Meyer M.D.   On: 11/11/2015 16:52    Ct Angio Chest Aorta W/cm &/or Wo/cm  11/11/2015  CLINICAL DATA:  55 year old female with chest pain radiating the back and diaphoresis. Concern for dissection. EXAM: CT ANGIOGRAPHY CHEST, ABDOMEN AND PELVIS TECHNIQUE: Multidetector CT imaging through the chest, abdomen and pelvis was performed using the standard protocol during bolus administration of intravenous contrast. Multiplanar reconstructed images and MIPs were obtained and reviewed to evaluate the vascular anatomy. CONTRAST:  100 cc Isovue 370 COMPARISON:  Chest radiograph dated 06/05 7 and CT dated 01/13/2011 FINDINGS: CTA CHEST FINDINGS There is minimal bibasilar dependent atelectatic changes of the lung bases. The lungs are otherwise clear. There is no pleural effusion or pneumothorax. The central airways are patent. The thoracic aorta appears unremarkable. There is no aneurysmal dilatation or dissection. The origins of the great vessels of the aortic arch appear patent. No central pulmonary artery embolus identified. There is no cardiomegaly or pericardial effusion.  No hilar or mediastinal adenopathy. Esophagus is grossly unremarkable. There is a 1.5 cm left thyroid hypodense nodule. Ultrasound recommended for further evaluation. There is no axillary adenopathy. The chest wall soft tissues appear unremarkable. The osseous structures are intact. Review of the MIP images confirms the above findings. CTA ABDOMEN AND PELVIS FINDINGS No intra-abdominal free air or free fluid. The liver, gallbladder, pancreas, spleen, adrenal glands, kidneys, visualized ureters, and urinary bladder appear unremarkable. Hysterectomy. There is moderate stool throughout the colon. No evidence of bowel obstruction or active inflammation. Normal appendix. The abdominal aorta appears unremarkable. There is no aneurysmal dilatation or evidence of dissection. The origins of the celiac axis, SMA, IMA as well as the origins of the renal arteries are patent. There is a classic  celiac axis branching pattern. There is a circumaortic left renal vein anatomy. 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 The IVC appears unremarkable. No portal venous gas identified. There is no adenopathy. The abdominal wall soft tissues appear unremarkable. The osseous structures are intact. Review of the MIP images confirms the above findings. IMPRESSION: No acute intrathoracic, abdominal, or pelvic pathology. No aortic dissection or aneurysm. No central pulmonary artery embolus. Electronically Signed   By: Elgie Collard M.D.   On: 11/11/2015 20:25   Ct Cta Abd/pel W/cm &/or W/o Cm  11/11/2015  CLINICAL DATA:  55 year old female with chest pain radiating the back and diaphoresis. Concern for dissection. EXAM: CT ANGIOGRAPHY CHEST, ABDOMEN AND PELVIS TECHNIQUE: Multidetector CT imaging through the chest, abdomen and pelvis was performed using the standard protocol during bolus administration of intravenous contrast. Multiplanar reconstructed images and MIPs were obtained and reviewed to evaluate the vascular anatomy. CONTRAST:  100 cc Isovue 370 COMPARISON:  Chest radiograph dated 06/05 7 and CT dated 01/13/2011 FINDINGS: CTA CHEST FINDINGS There is minimal bibasilar dependent atelectatic changes of the lung bases. The lungs are otherwise clear. There is no pleural effusion or pneumothorax. The central airways are patent. The thoracic aorta appears unremarkable. There is no aneurysmal dilatation or dissection. The origins of the great vessels of the aortic arch appear patent. No central pulmonary artery embolus identified. There is no cardiomegaly or pericardial effusion. No hilar or mediastinal adenopathy. Esophagus is grossly unremarkable. There is a 1.5 cm left thyroid hypodense nodule. Ultrasound recommended for further evaluation. There is no  axillary adenopathy. The chest wall soft tissues appear unremarkable. The osseous structures are intact. Review of the MIP images confirms the above findings. CTA ABDOMEN AND PELVIS FINDINGS No intra-abdominal free air or free fluid. The liver, gallbladder, pancreas, spleen, adrenal glands, kidneys, visualized ureters, and urinary bladder appear unremarkable. Hysterectomy. There is moderate stool throughout the colon. No evidence of bowel obstruction or active inflammation. Normal appendix. The abdominal aorta appears unremarkable. There is no aneurysmal dilatation or evidence of dissection. The origins of the celiac axis, SMA, IMA as well as the origins of the renal arteries are patent. There is a classic celiac axis branching pattern. There is a circumaortic left renal vein anatomy. 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 The IVC appears unremarkable. No portal venous gas identified. There is no adenopathy. The abdominal wall soft  tissues appear unremarkable. The osseous structures are intact. Review of the MIP images confirms the above findings. IMPRESSION: No acute intrathoracic, abdominal, or pelvic pathology. No aortic dissection or aneurysm. No central pulmonary artery embolus. Electronically Signed   By: Elgie Collard M.D.   On: 11/11/2015 20:25   I have personally reviewed and evaluated these images and lab results as part of my medical decision-making.   EKG Interpretation None      MDM   Final diagnoses:  Chest pain, unspecified chest pain type    Pt is an 55 y.o. female presenting with two weeks of chest pain that radiates to her back with associated intermittent diaphoresis. Her workup in the ED is unrevealing. EKG is nonacute, trop negative. CXR and CT dissection study are negative. She is well appearing on exam with no focal  findings and no hemodynamic instability. Instructed f/u with PCP. Discussed she does have a history of GERD which very well could be contributing to pt's persistent symptoms. Instructed trial of increasing home omeprazole from 20mg  daily to 40mg  daily. ER return precautions given.    Carlene Coria, PA-C 11/11/15 2223  Mancel Bale, MD 11/12/15 [phone]

## 2016-03-09 ENCOUNTER — Encounter (HOSPITAL_COMMUNITY): Payer: Self-pay

## 2016-03-09 ENCOUNTER — Emergency Department (HOSPITAL_COMMUNITY): Payer: Non-veteran care

## 2016-03-09 ENCOUNTER — Emergency Department (HOSPITAL_COMMUNITY)
Admission: EM | Admit: 2016-03-09 | Discharge: 2016-03-09 | Disposition: A | Discharge status: Home / Self Care | Payer: Non-veteran care | Attending: Emergency Medicine | Admitting: Emergency Medicine

## 2016-03-09 DIAGNOSIS — J45909 Unspecified asthma, uncomplicated: Secondary | ICD-10-CM | POA: Insufficient documentation

## 2016-03-09 DIAGNOSIS — S56911A Strain of unspecified muscles, fascia and tendons at forearm level, right arm, initial encounter: Secondary | ICD-10-CM | POA: Insufficient documentation

## 2016-03-09 DIAGNOSIS — Y939 Activity, unspecified: Secondary | ICD-10-CM | POA: Diagnosis not present

## 2016-03-09 DIAGNOSIS — Z79899 Other long term (current) drug therapy: Secondary | ICD-10-CM | POA: Diagnosis not present

## 2016-03-09 DIAGNOSIS — S56919A Strain of unspecified muscles, fascia and tendons at forearm level, unspecified arm, initial encounter: Secondary | ICD-10-CM

## 2016-03-09 DIAGNOSIS — S59911A Unspecified injury of right forearm, initial encounter: Secondary | ICD-10-CM | POA: Diagnosis present

## 2016-03-09 DIAGNOSIS — M25542 Pain in joints of left hand: Secondary | ICD-10-CM | POA: Diagnosis not present

## 2016-03-09 DIAGNOSIS — Y999 Unspecified external cause status: Secondary | ICD-10-CM | POA: Diagnosis not present

## 2016-03-09 DIAGNOSIS — T1490XA Injury, unspecified, initial encounter: Secondary | ICD-10-CM

## 2016-03-09 DIAGNOSIS — Y92828 Other wilderness area as the place of occurrence of the external cause: Secondary | ICD-10-CM | POA: Diagnosis not present

## 2016-03-09 LAB — CBC WITH DIFFERENTIAL/PLATELET
BASOS ABS: 0 10*3/uL (ref 0.0–0.1)
BASOS PCT: 1 %
EOS PCT: 2 %
Eosinophils Absolute: 0.2 10*3/uL (ref 0.0–0.7)
HCT: 37.4 % (ref 36.0–46.0)
Hemoglobin: 12.5 g/dL (ref 12.0–15.0)
Lymphocytes Relative: 52 %
Lymphs Abs: 4.2 10*3/uL — ABNORMAL HIGH (ref 0.7–4.0)
MCH: 28.7 pg (ref 26.0–34.0)
MCHC: 33.4 g/dL (ref 30.0–36.0)
MCV: 86 fL (ref 78.0–100.0)
MONO ABS: 0.2 10*3/uL (ref 0.1–1.0)
Monocytes Relative: 3 %
Neutro Abs: 3.2 10*3/uL (ref 1.7–7.7)
Neutrophils Relative %: 42 %
PLATELETS: 264 10*3/uL (ref 150–400)
RBC: 4.35 MIL/uL (ref 3.87–5.11)
RDW: 13.8 % (ref 11.5–15.5)
WBC: 7.8 10*3/uL (ref 4.0–10.5)

## 2016-03-09 LAB — BASIC METABOLIC PANEL
ANION GAP: 11 (ref 5–15)
BUN: 15 mg/dL (ref 6–20)
CALCIUM: 9.9 mg/dL (ref 8.9–10.3)
CO2: 21 mmol/L — ABNORMAL LOW (ref 22–32)
CREATININE: 0.78 mg/dL (ref 0.44–1.00)
Chloride: 109 mmol/L (ref 101–111)
GLUCOSE: 120 mg/dL — AB (ref 65–99)
Potassium: 3.7 mmol/L (ref 3.5–5.1)
Sodium: 141 mmol/L (ref 135–145)

## 2016-03-09 MED ORDER — MORPHINE SULFATE (PF) 4 MG/ML IV SOLN
4.0000 mg | Freq: Once | INTRAVENOUS | Status: AC
  Administered 2016-03-09: 4 mg via INTRAVENOUS
  Filled 2016-03-09: qty 1

## 2016-03-09 NOTE — ED Triage Notes (Signed)
See narrator
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
## 2016-03-09 NOTE — ED Notes (Signed)
Patient transported to X-ray 

## 2016-03-10 ENCOUNTER — Encounter (HOSPITAL_COMMUNITY): Payer: Self-pay | Admitting: Emergency Medicine

## 2016-06-07 ENCOUNTER — Emergency Department (HOSPITAL_COMMUNITY)
Admission: EM | Admit: 2016-06-07 | Discharge: 2016-06-07 | Discharge status: Absent without leave | Payer: Non-veteran care

## 2016-06-08 ENCOUNTER — Encounter (HOSPITAL_COMMUNITY): Payer: Self-pay | Admitting: Emergency Medicine

## 2016-06-08 ENCOUNTER — Emergency Department (HOSPITAL_COMMUNITY): Payer: Non-veteran care

## 2016-06-08 ENCOUNTER — Emergency Department (HOSPITAL_COMMUNITY)
Admission: EM | Admit: 2016-06-08 | Discharge: 2016-06-08 | Disposition: A | Discharge status: Home / Self Care | Payer: Non-veteran care | Attending: Emergency Medicine | Admitting: Emergency Medicine

## 2016-06-08 DIAGNOSIS — Z79899 Other long term (current) drug therapy: Secondary | ICD-10-CM | POA: Diagnosis not present

## 2016-06-08 DIAGNOSIS — K5900 Constipation, unspecified: Secondary | ICD-10-CM | POA: Insufficient documentation

## 2016-06-08 DIAGNOSIS — J45909 Unspecified asthma, uncomplicated: Secondary | ICD-10-CM | POA: Diagnosis not present

## 2016-06-08 DIAGNOSIS — I10 Essential (primary) hypertension: Secondary | ICD-10-CM | POA: Insufficient documentation

## 2016-06-08 DIAGNOSIS — R109 Unspecified abdominal pain: Secondary | ICD-10-CM | POA: Diagnosis present

## 2016-06-08 LAB — URINALYSIS, ROUTINE W REFLEX MICROSCOPIC
Bilirubin Urine: NEGATIVE
Glucose, UA: NEGATIVE mg/dL
Hgb urine dipstick: NEGATIVE
Ketones, ur: NEGATIVE mg/dL
NITRITE: NEGATIVE
Protein, ur: NEGATIVE mg/dL
SPECIFIC GRAVITY, URINE: 1.015 (ref 1.005–1.030)
pH: 5 (ref 5.0–8.0)

## 2016-06-08 LAB — CBC
HEMATOCRIT: 34.3 % — AB (ref 36.0–46.0)
Hemoglobin: 11.5 g/dL — ABNORMAL LOW (ref 12.0–15.0)
MCH: 28 pg (ref 26.0–34.0)
MCHC: 33.5 g/dL (ref 30.0–36.0)
MCV: 83.7 fL (ref 78.0–100.0)
Platelets: 282 10*3/uL (ref 150–400)
RBC: 4.1 MIL/uL (ref 3.87–5.11)
RDW: 13.2 % (ref 11.5–15.5)
WBC: 6.4 10*3/uL (ref 4.0–10.5)

## 2016-06-08 LAB — COMPREHENSIVE METABOLIC PANEL
ALBUMIN: 4.7 g/dL (ref 3.5–5.0)
ALT: 20 U/L (ref 14–54)
AST: 22 U/L (ref 15–41)
Alkaline Phosphatase: 89 U/L (ref 38–126)
Anion gap: 8 (ref 5–15)
BUN: 10 mg/dL (ref 6–20)
CO2: 22 mmol/L (ref 22–32)
Calcium: 9.6 mg/dL (ref 8.9–10.3)
Chloride: 108 mmol/L (ref 101–111)
Creatinine, Ser: 0.67 mg/dL (ref 0.44–1.00)
GFR calc Af Amer: >60 mL/min (ref >60)
GFR calc non Af Amer: >60 mL/min (ref >60)
GLUCOSE: 157 mg/dL — AB (ref 65–99)
POTASSIUM: 3.6 mmol/L (ref 3.5–5.1)
Sodium: 138 mmol/L (ref 135–145)
TOTAL PROTEIN: 7.7 g/dL (ref 6.5–8.1)
Total Bilirubin: 0.4 mg/dL (ref 0.3–1.2)

## 2016-06-08 LAB — LIPASE, BLOOD: Lipase: 25 U/L (ref 11–51)

## 2016-06-08 MED ORDER — IOPAMIDOL (ISOVUE-300) INJECTION 61%
100.0000 mL | Freq: Once | INTRAVENOUS | Status: AC | PRN
  Administered 2016-06-08: 100 mL via INTRAVENOUS

## 2016-06-08 MED ORDER — ONDANSETRON HCL 4 MG/2ML IJ SOLN
4.0000 mg | Freq: Once | INTRAMUSCULAR | Status: AC
  Administered 2016-06-08: 4 mg via INTRAVENOUS
  Filled 2016-06-08: qty 2

## 2016-06-08 MED ORDER — MINERAL OIL RE ENEM: 1.0000 | ENEMA | Freq: Once | RECTAL | 0 refills | Status: AC

## 2016-06-08 MED ORDER — BENZOCAINE 20 % MT AERO: INHALATION_SPRAY | Freq: Once | OROMUCOSAL | Status: DC

## 2016-06-08 MED ORDER — HYDROCODONE-ACETAMINOPHEN 5-325 MG PO TABS
2.0000 | ORAL_TABLET | Freq: Once | ORAL | Status: AC
  Administered 2016-06-08: 2 via ORAL
  Filled 2016-06-08: qty 2

## 2016-06-08 MED ORDER — SODIUM CHLORIDE 0.9 % IV BOLUS (SEPSIS)
1000.0000 mL | Freq: Once | INTRAVENOUS | Status: AC
  Administered 2016-06-08: 1000 mL via INTRAVENOUS

## 2016-06-08 MED ORDER — HYDROMORPHONE HCL 1 MG/ML IJ SOLN: 1.0000 mg | INTRAMUSCULAR | Status: DC | PRN

## 2016-06-08 MED ORDER — IOPAMIDOL (ISOVUE-300) INJECTION 61%
INTRAVENOUS | Status: DC
Start: 2016-06-08 — End: 2016-06-08
  Filled 2016-06-08: qty 100

## 2016-06-08 MED ORDER — MAGNESIUM CITRATE PO SOLN: 1.0000 | Freq: Once | ORAL | 1 refills | Status: AC

## 2016-06-08 MED ORDER — LIDOCAINE HCL 2 % EX GEL: 1.0000 "application " | Freq: Once | CUTANEOUS | Status: DC

## 2016-06-08 MED ORDER — MORPHINE SULFATE (PF) 4 MG/ML IV SOLN
4.0000 mg | Freq: Once | INTRAVENOUS | Status: AC
  Administered 2016-06-08: 4 mg via INTRAVENOUS
  Filled 2016-06-08: qty 1

## 2016-06-08 NOTE — ED Notes (Signed)
EDP notified that pt is requesting additional pain medication

## 2016-06-08 NOTE — Discharge Instructions (Signed)
CT shows severe constipation. No obstruction. Start with mag citrate oral solution. Use enema only if there is no results with mag citrate.

## 2016-06-08 NOTE — ED Triage Notes (Signed)
Pt reports having nausea and abd pain since 06/04/16. Pt also reporting constipation for the last several days.

## 2016-06-08 NOTE — ED Provider Notes (Signed)
[facility]-EMERGENCY DEPT Provider Note   CSN: [id number] Arrival date & time: 06/08/16  0107  By signing my name below, I, Talbert Nan, attest that this documentation has been prepared under the direction and in the presence of Derwood Kaplan, MD. Electronically Signed: Talbert Nan, Scribe. 06/08/16. 3:01 AM.     History   Chief Complaint Chief Complaint  Patient presents with  . Abdominal Pain  . Nausea    HPI HPI Comments: Nancy Tanner is a 56 y.o. female with h/o HTN and sarcoidosis who presents to the Emergency Department complaining of persistent moderate abdominal pain that began 4 days ago. Pt has associated bloating and constipation. She took a suppository earlier today and passed a golf ball sized stool. She is not normally constipated. The last time she was constipated was after surgery and when she had been taking oxycodone. She has never had a SBO. She has not been passing gas for the last 2 days. Pt has had hysterectomy.    The history is provided by the patient. No language interpreter was used.    Past Medical History:  Diagnosis Date  . Asthma   . GERD (gastroesophageal reflux disease) t  . History of sarcoidosis t    There are no active problems to display for this patient.   History reviewed. No pertinent surgical history.  OB History    No data available       Home Medications    Prior to Admission medications   Medication Sig Start Date End Date Taking? Authorizing Provider  acetaminophen (TYLENOL) 325 MG tablet Take 325-650 mg by mouth every 6 (six) hours as needed for mild pain, moderate pain or headache. For pain    Historical Provider, MD  acetaminophen (TYLENOL) 325 MG tablet Take 650 mg by mouth every 6 (six) hours as needed for mild pain.    Historical Provider, MD  albuterol (PROVENTIL HFA;VENTOLIN HFA) 108 (90 BASE) MCG/ACT inhaler Inhale 2 puffs into the lungs every 6 (six) hours as needed. For shortness of breath    Historical Provider, MD    albuterol (PROVENTIL HFA;VENTOLIN HFA) 108 (90 Base) MCG/ACT inhaler Inhale 1 puff into the lungs every 6 (six) hours as needed for wheezing or shortness of breath.    Historical Provider, MD  azelastine (ASTELIN) 0.1 % nasal spray Place 2 sprays into both nostrils See admin instructions. Once to twice daily    Historical Provider, MD  azelastine (ASTELIN) 0.1 % nasal spray Place 1 spray into both nostrils every morning. Use in each nostril as directed    Historical Provider, MD  CAMPHOR-MENTHOL EX Apply 1 application topically daily as needed (for symptoms).    Historical Provider, MD  cetirizine (ZYRTEC) 10 MG tablet Take 10 mg by mouth daily.    Historical Provider, MD  cetirizine (ZYRTEC) 10 MG tablet Take 10 mg by mouth daily.    Historical Provider, MD  CYCLOBENZAPRINE HCL PO Take 1 tablet by mouth every 6 (six) hours as needed (for muscle spasms).    Historical Provider, MD  DICLOFENAC PO Take 1 tablet by mouth daily.    Historical Provider, MD  diphenhydrAMINE (BENADRYL) 12.5 MG/5ML elixir Take 12.5 mg by mouth 4 (four) times daily as needed for allergies.    Historical Provider, MD  diphenhydrAMINE (BENADRYL) 12.5 MG/5ML liquid Take 25 mg by mouth daily as needed for itching or allergies.    Historical Provider, MD  FLUoxetine (PROZAC) 10 MG capsule Take 30 mg by mouth daily.  Historical Provider, MD  FLUoxetine (PROZAC) 10 MG tablet Take 30-40 mg by mouth daily.    Historical Provider, MD  HYDROcodone-acetaminophen (NORCO/VICODIN) 5-325 MG per tablet Take 1 tablet by mouth every 4 (four) hours as needed for pain. 03/15/12   Carleene CooperAlan Davidson, MD  Hypromellose (ARTIFICIAL TEARS OP) Place 1 drop into both eyes 2 (two) times daily.    Historical Provider, MD  magnesium citrate SOLN Take 296 mLs (1 Bottle total) by mouth once. 06/08/16 06/08/16  Derwood KaplanAnkit Macil Crady, MD  mineral oil enema Place 133 mLs (1 enema total) rectally once. 06/08/16 06/08/16  Derwood KaplanAnkit Aeson Sawyers, MD  montelukast (SINGULAIR) 10 MG tablet  Take 10 mg by mouth at bedtime.    Historical Provider, MD  montelukast (SINGULAIR) 10 MG tablet Take 10 mg by mouth at bedtime.    Historical Provider, MD  NIFEdipine (PROCARDIA-XL/ADALAT CC) 30 MG 24 hr tablet Take 30 mg by mouth daily.    Historical Provider, MD  NIFEdipine (PROCARDIA-XL/ADALAT-CC/NIFEDICAL-XL) 30 MG 24 hr tablet Take 30 mg by mouth every morning.    Historical Provider, MD  omeprazole (PRILOSEC) 20 MG capsule Take 20 mg by mouth daily.    Historical Provider, MD  omeprazole (PRILOSEC) 20 MG capsule Take 20 mg by mouth daily.    Historical Provider, MD  OVER THE COUNTER MEDICATION Take 1 tablet by mouth daily as needed. Kohosh tablet for hot flashes    Historical Provider, MD  polyvinyl alcohol-povidone (HYPOTEARS) 1.4-0.6 % ophthalmic solution Place 1-2 drops into both eyes daily as needed (for dry eyes).     Historical Provider, MD    Family History History reviewed. No pertinent family history.  Social History Social History  Substance Use Topics  . Smoking status: Never Smoker  . Smokeless tobacco: Never Used  . Alcohol use No     Allergies   Norvasc [amlodipine besylate]; Shellfish allergy; Sulfa antibiotics; Amlodipine; Codeine; Codeine; Eggs or egg-derived products; Lactose intolerance (gi); Shellfish allergy; Sulfa antibiotics; and Tape   Review of Systems Review of Systems  Gastrointestinal: Positive for abdominal distention, abdominal pain and constipation.   A complete 10 system review of systems was obtained and all systems are negative except as noted in the HPI and PMH.    Physical Exam Updated Vital Signs BP 117/79   Pulse 88   Temp 98.3 F (36.8 C) (Oral)   Resp 16   Ht 5\' 6"  (1.676 m)   Wt 185 lb (83.9 kg)   SpO2 100%   BMI 29.86 kg/m   Physical Exam  Constitutional: She is oriented to person, place, and time. She appears well-developed and well-nourished.  HENT:  Head: Normocephalic and atraumatic.  Cardiovascular: Normal rate,  regular rhythm and normal heart sounds.  Exam reveals no gallop and no friction rub.   No murmur heard. Pulmonary/Chest: Effort normal and breath sounds normal. No respiratory distress. She has no wheezes. She has no rales.  Lungs are clear to auscultation.   Abdominal: Soft. She exhibits distension. There is tenderness.  Abdomen distended. No audible bowel sounds. Generalized abdominal tenderness. Abdomen is soft.  Neurological: She is alert and oriented to person, place, and time.  Skin: Skin is warm and dry.  Psychiatric: She has a normal mood and affect.  Nursing note and vitals reviewed.    ED Treatments / Results   DIAGNOSTIC STUDIES: Oxygen Saturation is 98% on room air, room air by my interpretation.    COORDINATION OF CARE: 2:09 AM Discussed treatment plan with pt at  bedside and pt agreed to plan.    Labs (all labs ordered are listed, but only abnormal results are displayed) Labs Reviewed  COMPREHENSIVE METABOLIC PANEL - Abnormal; Notable for the following:       Result Value   Glucose, Bld 157 (*)    All other components within normal limits  CBC - Abnormal; Notable for the following:    Hemoglobin 11.5 (*)    HCT 34.3 (*)    All other components within normal limits  URINALYSIS, ROUTINE W REFLEX MICROSCOPIC - Abnormal; Notable for the following:    Leukocytes, UA SMALL (*)    Bacteria, UA RARE (*)    Squamous Epithelial / LPF 0-5 (*)    All other components within normal limits  LIPASE, BLOOD    EKG  EKG Interpretation None       Radiology Ct Abdomen Pelvis W Contrast  Result Date: 06/08/2016 CLINICAL DATA:  Abdominal pain and bloating, constipation for 4 days. History of hysterectomy, reflux disease, sarcoidosis. EXAM: CT ABDOMEN AND PELVIS WITH CONTRAST TECHNIQUE: Multidetector CT imaging of the abdomen and pelvis was performed using the standard protocol following bolus administration of intravenous contrast. CONTRAST:  100 cc Isovue 300 COMPARISON:   CT abdomen and pelvis November 11, 2015 FINDINGS: LOWER CHEST: Bibasilar dependent atelectasis. Heart size is normal. No pericardial effusions. HEPATOBILIARY: Trace intrahepatic biliary dilatation. Gallbladder is normal. PANCREAS: Normal. SPLEEN: Normal. ADRENALS/URINARY TRACT: Kidneys are orthotopic, demonstrating symmetric enhancement. No nephrolithiasis, hydronephrosis or solid renal masses. The unopacified ureters are normal in course and caliber. Delayed imaging through the kidneys demonstrates symmetric prompt contrast excretion within the proximal urinary collecting system. Urinary bladder is partially distended and unremarkable. Normal adrenal glands. STOMACH/BOWEL: The stomach, small and large bowel are normal in course and caliber without inflammatory changes. Moderate amount of retained large bowel stool. Normal appendix. VASCULAR/LYMPHATIC: Aortoiliac vessels are normal in course and caliber. No lymphadenopathy by CT size criteria. REPRODUCTIVE: Status post hysterectomy. OTHER: No intraperitoneal free fluid or free air. MUSCULOSKELETAL: Nonacute.  Anterior abdominal wall scarring. IMPRESSION: Mild intrahepatic biliary dilatation.  Normal gallbladder. Moderate amount of retained large bowel stool, no bowel obstruction. Electronically Signed   By: Awilda Metro M.D.   On: 06/08/2016 05:20    Procedures Procedures (including critical care time)  Medications Ordered in ED Medications  morphine 4 MG/ML injection 4 mg (4 mg Intravenous Given 06/08/16 0319)  ondansetron (ZOFRAN) injection 4 mg (4 mg Intravenous Given 06/08/16 0320)  sodium chloride 0.9 % bolus 1,000 mL (0 mLs Intravenous Stopped 06/08/16 0557)  HYDROcodone-acetaminophen (NORCO/VICODIN) 5-325 MG per tablet 2 tablet (2 tablets Oral Given 06/08/16 0437)  iopamidol (ISOVUE-300) 61 % injection 100 mL (100 mLs Intravenous Contrast Given 06/08/16 0500)     Initial Impression / Assessment and Plan / ED Course  I have reviewed the triage vital  signs and the nursing notes.  Pertinent labs & imaging results that were available during my care of the patient were reviewed by me and considered in my medical decision making (see chart for details).  Clinical Course    Pt comes in with cc of abd pain, bloating, distention and reports not passing flatus x 2 days. Pt has had abd surgery in the past. Ct ordered and rules out SBO,   Results from the ER workup discussed with the patient face to face and all questions answered to the best of my ability.  Strict ER return precautions have been discussed, and patient is agreeing with the plan  and is comfortable with the workup done and the recommendations from the ER.   Final Clinical Impressions(s) / ED Diagnoses   Final diagnoses:  Constipation, unspecified constipation type    New Prescriptions Discharge Medication List as of 06/08/2016  5:47 AM    START taking these medications   Details  magnesium citrate SOLN Take 296 mLs (1 Bottle total) by mouth once., Starting Mon 06/08/2016, Print    mineral oil enema Place 133 mLs (1 enema total) rectally once., Starting Mon 06/08/2016, Print       I personally performed the services described in this documentation, which was scribed in my presence. The recorded information has been reviewed and is accurate.     Derwood Kaplan, MD 06/08/16 [phone]

## 2016-08-17 ENCOUNTER — Ambulatory Visit: Payer: Non-veteran care | Admitting: Physical Therapy

## 2016-08-25 ENCOUNTER — Ambulatory Visit
Payer: No Typology Code available for payment source | Attending: Obstetrics and Gynecology | Admitting: Physical Therapy

## 2016-08-25 ENCOUNTER — Encounter: Payer: Self-pay | Admitting: Physical Therapy

## 2016-08-25 DIAGNOSIS — M62838 Other muscle spasm: Secondary | ICD-10-CM | POA: Diagnosis not present

## 2016-08-25 DIAGNOSIS — R279 Unspecified lack of coordination: Secondary | ICD-10-CM | POA: Diagnosis present

## 2016-08-25 DIAGNOSIS — M6281 Muscle weakness (generalized): Secondary | ICD-10-CM | POA: Insufficient documentation

## 2016-08-25 NOTE — Therapy (Addendum)
[facility] [address], [address] [place], [place], [address] Phone: [phone]   Fax:  [phone]  Physical Therapy Evaluation  Patient Details  Name: Nancy Tanner MRN: [id number] Date of Birth: 12/06/1960 Referring Provider: Dr. Elizabeth Deans  Encounter Date: 08/25/2016    Past Medical History:  Diagnosis Date  . Asthma   . GERD (gastroesophageal reflux disease) t  . History of sarcoidosis t    Past Surgical History:  Procedure Laterality Date  . ABDOMINAL HYSTERECTOMY  09/1999    There were no vitals filed for this visit.                                PT Short Term Goals - 08/25/16 1143      PT SHORT TERM GOAL #1   Title independent with initial HEP for flexibility and diaphragmatic breathing   Time 4   Period Weeks   Status New     PT SHORT TERM GOAL #2   Title understand how to perform perineal massage to relax the tissue and abdominal massage   Time 4   Period Weeks   Status New     PT SHORT TERM GOAL #3   Title understand correct toileting technique to have a bowel movement with greater ease and less straining   Time 4   Period Weeks   Status New     PT SHORT TERM GOAL #4   Title lower abdominal pain decreased >/= 25% due to abilty to relax her muscles   Time 4   Period Weeks   Status New           PT Long Term Goals - 08/25/16 1149      PT LONG TERM GOAL #1   Title independent with HEP and how to progress herself   Time 12   Period Weeks   Status New     PT LONG TERM GOAL #2   Title lower abdominal pain decreased >/= 75% due to inreased mobility of perineal body with full excursion and abdominal tissue   Time 12   Period Weeks   Status New     PT LONG TERM GOAL #3   Title ability to have a bowel movement every 1 to 2 days due to relaxing the pelvic floor muscles and use diaphgramatic breathing   Time 12   Period Weeks   Status New     PT LONG  TERM GOAL #4   Title wake up to urinate at night </= 1 time per night due to decreased tension of the pelvic floor muscles   Time 12   Status New     PT LONG TERM GOAL #5   Title FOTO score </= 45% limitation   Time 12   Period Weeks   Status New             Patient will benefit from skilled therapeutic intervention in order to improve the following deficits and impairments:  Decreased coordination, Decreased range of motion, Increased fascial restricitons, Impaired tone, Decreased endurance, Increased muscle spasms, Pain, Decreased mobility, Decreased strength, Impaired flexibility, Decreased scar mobility  Visit Diagnosis: Other muscle spasm - Plan: PT plan of care cert/re-cert  Unspecified lack of coordination - Plan: PT plan of care cert/re-cert  Muscle weakness (generalized) - Plan: PT plan of care cert/re-cert      G-Codes - 09/22/16 1258    Functional   Assessment Tool Used (Outpatient Only) FOTO score is 56% limitation for bowel    Functional Limitation Other PT primary   Other PT Primary Goal Status (G8991) At least 40 percent but less than 60 percent impaired, limited or restricted   Other PT Primary Discharge Status (G8992) At least 40 percent but less than 60 percent impaired, limited or restricted       Problem List There are no active problems to display for this patient.   Cheryl Gray, PT 09/22/16 1:01 PM   [facility] [address], [address] [place], [place], [address] Phone: [phone]   Fax:  [phone]  Name: Nancy Tanner MRN: [id number] Date of Birth: 10/26/1960  PHYSICAL THERAPY DISCHARGE SUMMARY  Visits from Start of Care: 1  Current functional level related to goals / functional outcomes: Patient only came for the initial evaluation.  Patient called on 09/22/2016 and wanted to be discharged with no reason.    Remaining deficits: See above.   Education / Equipment: HEP   Plan: Patient agrees to discharge.  Patient goals were not met. Patient is being discharged due to the patient's request.  Thank you for the referral. Cheryl Gray, PT 09/22/16 1:01 PM  ?????      

## 2016-08-25 NOTE — Patient Instructions (Signed)
About Abdominal Massage  Abdominal massage, also called external colon massage, is a self-treatment circular massage technique that can reduce and eliminate gas and ease constipation. The colon naturally contracts in waves in a clockwise direction starting from inside the right hip, moving up toward the ribs, across the belly, and down inside the left hip.  When you perform circular abdominal massage, you help stimulate your colon's normal wave pattern of movement called peristalsis.  It is most beneficial when done after eating.  Positioning You can practice abdominal massage with oil while lying down, or in the shower with soap.  Some people find that it is just as effective to do the massage through clothing while sitting or standing.  How to Massage Start by placing your finger tips or knuckles on your right side, just inside your hip bone.  . Make small circular movements while you move upward toward your rib cage.   . Once you reach the bottom right side of your rib cage, take your circular movements across to the left side of the bottom of your rib cage.  . Next, move downward until you reach the inside of your left hip bone.  This is the path your feces travel in your colon. . Continue to perform your abdominal massage in this pattern for 10 minutes each day.     You can apply as much pressure as is comfortable in your massage.  Start gently and build pressure as you continue to practice.  Notice any areas of pain as you massage; areas of slight pain may be relieved as you massage, but if you have areas of significant or intense pain, consult with your healthcare provider.  Other Considerations . General physical activity including bending and stretching can have a beneficial massage-like effect on the colon.  Deep breathing can also stimulate the colon because breathing deeply activates the same nervous system that supplies the colon.   . Abdominal massage should always be used in  combination with a bowel-conscious diet that is high in the proper type of fiber for you, fluids (primarily water), and a regular exercise program. Toileting Techniques for Bowel Movements (Defecation) Using your belly (abdomen) and pelvic floor muscles to have a bowel movement is usually instinctive.  Sometimes people can have problems with these muscles and have to relearn proper defecation (emptying) techniques.  If you have weakness in your muscles, organs that are falling out, decreased sensation in your pelvis, or ignore your urge to go, you may find yourself straining to have a bowel movement.  You are straining if you are: . holding your breath or taking in a huge gulp of air and holding it  . keeping your lips and jaw tensed and closed tightly . turning red in the face because of excessive pushing or forcing . developing or worsening your  hemorrhoids . getting faint while pushing . not emptying completely and have to defecate many times a day  If you are straining, you are actually making it harder for yourself to have a bowel movement.  Many people find they are pulling up with the pelvic floor muscles and closing off instead of opening the anus. Due to lack pelvic floor relaxation and coordination the abdominal muscles, one has to work harder to push the feces out.  Many people have never been taught how to defecate efficiently and effectively.  Notice what happens to your body when you are having a bowel movement.  While you are sitting on the   toilet pay attention to the following areas: . Jaw and mouth position . Angle of your hips   . Whether your feet touch the ground or not . Arm placement  . Spine position . Waist . Belly tension . Anus (opening of the anal canal)  An Evacuation/Defecation Plan   Here are the 4 basic points:  1. Lean forward enough for your elbows to rest on your knees 2. Support your feet on the floor or use a low stool if your feet don't touch the floor    3. Push out your belly as if you have swallowed a beach ball-you should feel a widening of your waist 4. Open and relax your pelvic floor muscles, rather than tightening around the anus      The following conditions my require modifications to your toileting posture:  . If you have had surgery in the past that limits your back, hip, pelvic, knee or ankle flexibility . Constipation   Your healthcare practitioner may make the following additional suggestions and adjustments:  1) Sit on the toilet  a) Make sure your feet are supported. b) Notice your hip angle and spine position-most people find it effective to lean forward or raise their knees, which can help the muscles around the anus to relax  c) When you lean forward, place your forearms on your thighs for support  2) Relax suggestions a) Breath deeply in through your nose and out slowly through your mouth as if you are smelling the flowers and blowing out the candles. b) To become aware of how to relax your muscles, contracting and releasing muscles can be helpful.  Pull your pelvic floor muscles in tightly by using the image of holding back gas, or closing around the anus (visualize making a circle smaller) and lifting the anus up and in.  Then release the muscles and your anus should drop down and feel open. Repeat 5 times ending with the feeling of relaxation. c) Keep your pelvic floor muscles relaxed; let your belly bulge out. d) The digestive tract starts at the mouth and ends at the anal opening, so be sure to relax both ends of the tube.  Place your tongue on the roof of your mouth with your teeth separated.  This helps relax your mouth and will help to relax the anus at the same time.  3) Empty (defecation) a) Keep your pelvic floor and sphincter relaxed, then bulge your anal muscles.  Make the anal opening wide.  b) Stick your belly out as if you have swallowed a beach ball. c) Make your belly wall hard using your belly  muscles while continuing to breathe. Doing this makes it easier to open your anus. d) Breath out and give a grunt (or try using other sounds such as ahhhh, shhhhh, ohhhh or grrrrrrr).  4) Finish a) As you finish your bowel movement, pull the pelvic floor muscles up and in.  This will leave your anus in the proper place rather than remaining pushed out and down. If you leave your anus pushed out and down, it will start to feel as though that is normal and give you incorrect signals about needing to have a bowel movement.    Earlie Counts, PT [facility] [address] [place], [place] [address] Sitting    Sit comfortably. Allow body's muscles to relax. Place hands on belly. Inhale slowly and deeply for _3__ seconds, so hands move out. Then take _3__ seconds to exhale.  Repeat _10__ times. Do _3__ times a day.  Copyright  VHI. All rights reserved.  Supine    Lie flat with pillow support. Allow body's muscles to relax. Place hands on belly. Inhale slowly and deeply for __3_ seconds, so hands move up. Then take _3__ seconds to exhale. Repeat _10__ times. Do ___ times a day. 3  Copyright  VHI. All rights reserved.  STRETCHING THE PELVIC FLOOR MUSCLES NO DILATOR  Supplies . Vaginal lubricant . Mirror (optional) . Gloves (optional) Positioning . Start in a semi-reclined position with your head propped up. Bend your knees and place your thumb or finger at the vaginal opening. Procedure . Apply a moderate amount of lubricant on the outer skin of your vagina, the labia minora.  Apply additional lubricant to your finger. Marland Kitchen. Spread the skin away from the vaginal opening. Place the end of your finger at the opening. . Do a maximum contraction of the pelvic floor muscles. Tighten the vagina and the anus maximally and relax. . When you know they are relaxed, gently and slowly insert your finger into your vagina, directing your finger slightly downward, for 2-3  inches of insertion. . Relax and stretch the 6 o'clock position . Hold each stretch for _2 min__ and repeat __1_ time with rest breaks of _1__ seconds between each stretch. . Repeat the stretching in the 4 o'clock and 8 o'clock positions. . Total time should be _6__ minutes, _1__ x per day.  Note the amount of theme your were able to achieve and your tolerance to your finger in your vagina. . Once you have accomplished the techniques you may try them in standing with one foot resting on the tub, or in other positions.  This is a good stretch to do in the shower if you don't need to use lubricant.

## 2016-09-09 ENCOUNTER — Encounter: Payer: Self-pay | Admitting: Physical Therapy

## 2016-09-17 ENCOUNTER — Encounter: Payer: Self-pay | Admitting: Physical Therapy

## 2016-09-22 DIAGNOSIS — M62838 Other muscle spasm: Secondary | ICD-10-CM | POA: Diagnosis not present

## 2016-09-24 ENCOUNTER — Encounter: Payer: Self-pay | Admitting: Physical Therapy

## 2016-10-01 ENCOUNTER — Encounter: Payer: Self-pay | Admitting: Physical Therapy

## 2016-12-10 ENCOUNTER — Emergency Department (HOSPITAL_COMMUNITY): Payer: Non-veteran care

## 2016-12-10 ENCOUNTER — Encounter (HOSPITAL_COMMUNITY): Payer: Self-pay

## 2016-12-10 ENCOUNTER — Ambulatory Visit (HOSPITAL_COMMUNITY)
Admission: EM | Admit: 2016-12-10 | Discharge: 2016-12-10 | Disposition: A | Discharge status: Home / Self Care | Payer: No Typology Code available for payment source

## 2016-12-10 ENCOUNTER — Emergency Department (HOSPITAL_COMMUNITY)
Admission: EM | Admit: 2016-12-10 | Discharge: 2016-12-10 | Disposition: A | Discharge status: Home / Self Care | Payer: Non-veteran care | Attending: Emergency Medicine | Admitting: Emergency Medicine

## 2016-12-10 DIAGNOSIS — L231 Allergic contact dermatitis due to adhesives: Secondary | ICD-10-CM | POA: Insufficient documentation

## 2016-12-10 DIAGNOSIS — J45909 Unspecified asthma, uncomplicated: Secondary | ICD-10-CM | POA: Insufficient documentation

## 2016-12-10 DIAGNOSIS — Z7902 Long term (current) use of antithrombotics/antiplatelets: Secondary | ICD-10-CM | POA: Insufficient documentation

## 2016-12-10 DIAGNOSIS — Z7951 Long term (current) use of inhaled steroids: Secondary | ICD-10-CM | POA: Diagnosis not present

## 2016-12-10 DIAGNOSIS — Z885 Allergy status to narcotic agent status: Secondary | ICD-10-CM | POA: Insufficient documentation

## 2016-12-10 DIAGNOSIS — Z79899 Other long term (current) drug therapy: Secondary | ICD-10-CM | POA: Insufficient documentation

## 2016-12-10 DIAGNOSIS — J069 Acute upper respiratory infection, unspecified: Secondary | ICD-10-CM | POA: Diagnosis not present

## 2016-12-10 DIAGNOSIS — R079 Chest pain, unspecified: Secondary | ICD-10-CM | POA: Diagnosis present

## 2016-12-10 LAB — CBC
HCT: 38.2 % (ref 36.0–46.0)
Hemoglobin: 12.7 g/dL (ref 12.0–15.0)
MCH: 28.7 pg (ref 26.0–34.0)
MCHC: 33.2 g/dL (ref 30.0–36.0)
MCV: 86.4 fL (ref 78.0–100.0)
PLATELETS: 255 10*3/uL (ref 150–400)
RBC: 4.42 MIL/uL (ref 3.87–5.11)
RDW: 13.4 % (ref 11.5–15.5)
WBC: 6.1 10*3/uL (ref 4.0–10.5)

## 2016-12-10 LAB — BASIC METABOLIC PANEL
Anion gap: 10 (ref 5–15)
BUN: 10 mg/dL (ref 6–20)
CHLORIDE: 107 mmol/L (ref 101–111)
CO2: 22 mmol/L (ref 22–32)
CREATININE: 0.82 mg/dL (ref 0.44–1.00)
Calcium: 10 mg/dL (ref 8.9–10.3)
GFR calc Af Amer: >60 mL/min (ref >60)
GFR calc non Af Amer: >60 mL/min (ref >60)
Glucose, Bld: 121 mg/dL — ABNORMAL HIGH (ref 65–99)
Potassium: 3.8 mmol/L (ref 3.5–5.1)
SODIUM: 139 mmol/L (ref 135–145)

## 2016-12-10 LAB — I-STAT TROPONIN, ED: Troponin i, poc: 0 ng/mL (ref 0.00–0.08)

## 2016-12-10 MED ORDER — BENZONATATE 100 MG PO CAPS: 100.0000 mg | ORAL_CAPSULE | Freq: Three times a day (TID) | ORAL | 0 refills | Status: DC

## 2016-12-10 NOTE — ED Provider Notes (Signed)
[facility]-EMERGENCY DEPT Provider Note   CSN: [id number] Arrival date & time: 12/10/16  1355     History   Chief Complaint Chief Complaint  Patient presents with  . Chest Pain    HPI Nancy Tanner is a 56 y.o. female.  The history is provided by the patient.  Illness  This is a new problem. The current episode started more than 1 week ago. The problem occurs daily. The problem has not changed since onset.Associated symptoms include chest pain. Pertinent negatives include no abdominal pain and no headaches. Associated symptoms comments: Productive cough . The symptoms are aggravated by coughing. Nothing relieves the symptoms. She has tried acetaminophen for the symptoms.    Past Medical History:  Diagnosis Date  . Asthma   . GERD (gastroesophageal reflux disease) t  . History of sarcoidosis t    There are no active problems to display for this patient.   Past Surgical History:  Procedure Laterality Date  . ABDOMINAL HYSTERECTOMY  09/1999    OB History    No data available       Home Medications    Prior to Admission medications   Medication Sig Start Date End Date Taking? Authorizing Provider  acetaminophen (TYLENOL) 325 MG tablet Take 325-650 mg by mouth every 6 (six) hours as needed for mild pain, moderate pain or headache. For pain    [provider]  acetaminophen (TYLENOL) 325 MG tablet Take 650 mg by mouth every 6 (six) hours as needed for mild pain.    [provider]  albuterol (PROVENTIL HFA;VENTOLIN HFA) 108 (90 BASE) MCG/ACT inhaler Inhale 2 puffs into the lungs every 6 (six) hours as needed. For shortness of breath    [provider]  albuterol (PROVENTIL HFA;VENTOLIN HFA) 108 (90 Base) MCG/ACT inhaler Inhale 1 puff into the lungs every 6 (six) hours as needed for wheezing or shortness of breath.    [provider]  azelastine (ASTELIN) 0.1 % nasal spray Place 2 sprays into both nostrils See admin instructions. Once to twice  daily    [provider]  azelastine (ASTELIN) 0.1 % nasal spray Place 1 spray into both nostrils every morning. Use in each nostril as directed    [provider]  benzonatate (TESSALON) 100 MG capsule Take 1 capsule (100 mg total) by mouth every 8 (eight) hours. 12/10/16   Orson Slick, MD  CAMPHOR-MENTHOL EX Apply 1 application topically daily as needed (for symptoms).    [provider]  cetirizine (ZYRTEC) 10 MG tablet Take 10 mg by mouth daily.    [provider]  cetirizine (ZYRTEC) 10 MG tablet Take 10 mg by mouth daily.    [provider]  CYCLOBENZAPRINE HCL PO Take 1 tablet by mouth every 6 (six) hours as needed (for muscle spasms).    [provider]  DICLOFENAC PO Take 1 tablet by mouth daily.    [provider]  diphenhydrAMINE (BENADRYL) 12.5 MG/5ML elixir Take 12.5 mg by mouth 4 (four) times daily as needed for allergies.    [provider]  diphenhydrAMINE (BENADRYL) 12.5 MG/5ML liquid Take 25 mg by mouth daily as needed for itching or allergies.    [provider]  estradiol (ESTRACE) 0.1 MG/GM vaginal cream Place 1 Applicatorful vaginally at bedtime.    [provider]  FLUoxetine (PROZAC) 10 MG capsule Take 30 mg by mouth daily.    [provider]  FLUoxetine (PROZAC) 10 MG tablet Take 30-40 mg by  mouth daily.    [provider]  HYDROcodone-acetaminophen (NORCO/VICODIN) 5-325 MG per tablet Take 1 tablet by mouth every 4 (four) hours as needed for pain. 03/15/12   Carleene Cooperavidson, Alan, MD  hydrocortisone cream 1 % Apply 1 application topically 2 (two) times daily.    [provider]  Hypromellose (ARTIFICIAL TEARS OP) Place 1 drop into both eyes 2 (two) times daily.    [provider]  montelukast (SINGULAIR) 10 MG tablet Take 10 mg by mouth at bedtime.    [provider]  montelukast (SINGULAIR) 10 MG tablet Take 10 mg by mouth at bedtime.    [provider]  NIFEdipine (PROCARDIA-XL/ADALAT CC) 30 MG 24 hr tablet Take 30 mg by mouth daily.    [provider]  NIFEdipine (PROCARDIA-XL/ADALAT-CC/NIFEDICAL-XL) 30 MG 24 hr tablet Take 30 mg by mouth every morning.    [provider]  omeprazole (PRILOSEC) 20 MG capsule Take 20 mg by mouth daily.    [provider]  omeprazole (PRILOSEC) 20 MG capsule Take 20 mg by mouth daily.    [provider]  OVER THE COUNTER MEDICATION Take 1 tablet by mouth daily as needed. Kohosh tablet for hot flashes    [provider]  polyvinyl alcohol-povidone (HYPOTEARS) 1.4-0.6 % ophthalmic solution Place 1-2 drops into both eyes daily as needed (for dry eyes).     [provider]    Family History No family history on file.  Social History Social History  Substance Use Topics  . Smoking status: Never Smoker  . Smokeless tobacco: Never Used  . Alcohol use No     Allergies   Norvasc [amlodipine besylate]; Shellfish allergy; Sulfa antibiotics; Amlodipine; Codeine; Codeine; Eggs or egg-derived products; Lactose intolerance (gi); Shellfish allergy; Sulfa antibiotics; and Tape   Review of Systems Review of Systems  Constitutional: Negative for chills and fever.  HENT: Positive for sinus pain. Negative for ear pain and sore throat.   Eyes: Negative for pain and visual disturbance.  Respiratory: Positive for cough. Negative for chest tightness, wheezing and stridor.   Cardiovascular: Positive for chest pain. Negative for palpitations and leg swelling.  Gastrointestinal: Negative for abdominal pain and vomiting.  Genitourinary: Negative for dysuria and hematuria.  Musculoskeletal: Negative for arthralgias and back pain.  Skin: Negative for color change and rash.  Neurological: Negative for seizures, syncope and headaches.  Psychiatric/Behavioral: Negative for agitation and behavioral problems.  All other systems reviewed and are  negative.    Physical Exam Updated Vital Signs BP 117/76 (BP Location: Right Arm)   Pulse 87   Temp 98.4 F (36.9 C) (Oral)   Resp 17   SpO2 100%   Physical Exam  Constitutional: She is oriented to person, place, and time. She appears well-developed and well-nourished. No distress.  HENT:  Head: Normocephalic and atraumatic.  Eyes: Conjunctivae and EOM are normal. Pupils are equal, round, and reactive to light.  Neck: Neck supple. No tracheal deviation present.  Cardiovascular: Normal rate and regular rhythm.   No murmur heard. Pulmonary/Chest: Effort normal and breath sounds normal. No respiratory distress. She has no wheezes. She has no rales.  Occasional dry cough   Abdominal: Soft. There is no tenderness.  Musculoskeletal: She exhibits no edema or deformity.  Neurological: She is alert and oriented to person, place, and time.  Skin: Skin is warm and dry.  Psychiatric: She has a normal mood and affect.  Nursing note and vitals reviewed.    ED Treatments /  Results  Labs (all labs ordered are listed, but only abnormal results are displayed) Labs Reviewed  BASIC METABOLIC PANEL - Abnormal; Notable for the following:       Result Value   Glucose, Bld 121 (*)    All other components within normal limits  CBC  I-STAT TROPOININ, ED    EKG  EKG Interpretation  Date/Time:  Thursday December 10 2016 14:01:22 EDT Ventricular Rate:  109 PR Interval:  138 QRS Duration: 80 QT Interval:  338 QTC Calculation: 455 R Axis:   52 Text Interpretation:  Sinus tachycardia Otherwise normal ECG no stemi Otherwise no significant change Confirmed by Drema Pry [phone]) on 12/10/2016 6:06:24 PM       Radiology Dg Chest 2 View  Result Date: 12/10/2016 CLINICAL DATA:  Chest pain and cough.  Fever. EXAM: CHEST  2 VIEW COMPARISON:  November 11, 2015 chest radiograph and chest CT FINDINGS: The lungs are clear. Heart size and pulmonary vascularity are normal. No adenopathy. No pneumothorax. No  bone lesions. IMPRESSION: No abnormality noted. Electronically Signed   By: Bretta Bang III M.D.   On: 12/10/2016 14:32    Procedures Procedures (including critical care time)  Medications Ordered in ED Medications - No data to display   Initial Impression / Assessment and Plan / ED Course  I have reviewed the triage vital signs and the nursing notes.  Pertinent labs & imaging results that were available during my care of the patient were reviewed by me and considered in my medical decision making (see chart for details).     Nancy Tanner is a 56 year old female with history of sarcoidosis coming in today with chest pain. Patient states she's had intermittent chest pain for several years now however over the last week it has been worsening and she has associated cough productive of yellow phlegm. No fevers. Patient states her pain is sharp and exacerbated by coughing in the substernal area. It is nonradiating. No nausea, vomiting, diaphoresis during these episodes. No fevers or other signs of infection.  On exam she is sitting up in bed in no apparent distress with stable vital signs satting 100% on room air. Cardiac exam is unremarkable and lungs are clear bilaterally. I-STAT troponin negative, CBC unremarkable, BMP also unremarkable. Chest x-ray shows no cardiopulmonary abnormality. EKG shows sinus tachycardia with no ST segment changes or other signs of ischemia. During my examination, patient had no tachycardia  Doubt ACS, PE-Wells score low risk, pericarditis/myocarditis, aortic dissection, or other acute intra-cardiac or thoracic emergency. Likely chest pain secondary to viral upper respiratory infection and coughing, as this is more consistent with her clinical picture. Patient reassured and discharged in stable condition and given strict return precautions. She was understanding and voiced agreement the plan was comfortable with outpatient follow-up in one week with her primary care  provider.  Patient was seen with my attending, Dr. Eudelia Bunch, who voiced agreement and oversaw the evaluation and treatment of this patient.   Dragon [employer] illustrator was used in the creation of this note. If there are any errors or inconsistencies needing clarification, please contact me directly.   Final Clinical Impressions(s) / ED Diagnoses   Final diagnoses:  Viral URI    New Prescriptions New Prescriptions   BENZONATATE (TESSALON) 100 MG CAPSULE    Take 1 capsule (100 mg total) by mouth every 8 (eight) hours.     Orson Slick, MD 12/10/16 [phone]

## 2016-12-10 NOTE — ED Provider Notes (Signed)
I have personally seen and examined the patient. I have reviewed the documentation on PMH/FH/Soc Hx. I have discussed the plan of care with the resident and patient.  I have reviewed and agree with the resident's documentation. Please see associated encounter note.   EKG Interpretation  Date/Time:  Thursday December 10 2016 14:01:22 EDT Ventricular Rate:  109 PR Interval:  138 QRS Duration: 80 QT Interval:  338 QTC Calculation: 455 R Axis:   52 Text Interpretation:  Sinus tachycardia Otherwise normal ECG no stemi Otherwise no significant change Confirmed by Drema Pryardama, Pedro [phone]) on 12/10/2016 6:06:24 PM         Eudelia Bunchardama, Amadeo GarnetPedro Eduardo, MD 12/10/16 [phone]

## 2016-12-10 NOTE — ED Triage Notes (Signed)
Pt reports sharp left sided chest pain and SOB that started 3 days ago associated with chills and sweating. Hx of Sarcoidosis.

## 2016-12-10 NOTE — ED Notes (Signed)
Pt left without receiving discharge instruction/prescriptions.

## 2018-01-20 ENCOUNTER — Emergency Department (HOSPITAL_COMMUNITY): Payer: Non-veteran care

## 2018-01-20 ENCOUNTER — Encounter (HOSPITAL_COMMUNITY): Payer: Self-pay | Admitting: Emergency Medicine

## 2018-01-20 ENCOUNTER — Emergency Department (HOSPITAL_COMMUNITY)
Admission: EM | Admit: 2018-01-20 | Discharge: 2018-01-20 | Disposition: A | Discharge status: Home / Self Care | Payer: Non-veteran care | Attending: Emergency Medicine | Admitting: Emergency Medicine

## 2018-01-20 DIAGNOSIS — M5412 Radiculopathy, cervical region: Secondary | ICD-10-CM | POA: Insufficient documentation

## 2018-01-20 DIAGNOSIS — Z79899 Other long term (current) drug therapy: Secondary | ICD-10-CM | POA: Diagnosis not present

## 2018-01-20 DIAGNOSIS — E876 Hypokalemia: Secondary | ICD-10-CM | POA: Insufficient documentation

## 2018-01-20 DIAGNOSIS — R079 Chest pain, unspecified: Secondary | ICD-10-CM | POA: Insufficient documentation

## 2018-01-20 DIAGNOSIS — J45909 Unspecified asthma, uncomplicated: Secondary | ICD-10-CM | POA: Diagnosis not present

## 2018-01-20 LAB — URINALYSIS, ROUTINE W REFLEX MICROSCOPIC
Bilirubin Urine: NEGATIVE
Glucose, UA: NEGATIVE mg/dL
Hgb urine dipstick: NEGATIVE
KETONES UR: NEGATIVE mg/dL
LEUKOCYTES UA: NEGATIVE
NITRITE: NEGATIVE
PH: 5 (ref 5.0–8.0)
PROTEIN: NEGATIVE mg/dL
Specific Gravity, Urine: 1.02 (ref 1.005–1.030)

## 2018-01-20 LAB — I-STAT BETA HCG BLOOD, ED (MC, WL, AP ONLY)

## 2018-01-20 LAB — I-STAT TROPONIN, ED: Troponin i, poc: 0 ng/mL (ref 0.00–0.08)

## 2018-01-20 LAB — CBC
HCT: 37.4 % (ref 36.0–46.0)
Hemoglobin: 11.9 g/dL — ABNORMAL LOW (ref 12.0–15.0)
MCH: 28.2 pg (ref 26.0–34.0)
MCHC: 31.8 g/dL (ref 30.0–36.0)
MCV: 88.6 fL (ref 78.0–100.0)
Platelets: 252 10*3/uL (ref 150–400)
RBC: 4.22 MIL/uL (ref 3.87–5.11)
RDW: 13 % (ref 11.5–15.5)
WBC: 7.1 10*3/uL (ref 4.0–10.5)

## 2018-01-20 LAB — BASIC METABOLIC PANEL
ANION GAP: 10 (ref 5–15)
BUN: 11 mg/dL (ref 6–20)
CALCIUM: 9.4 mg/dL (ref 8.9–10.3)
CO2: 24 mmol/L (ref 22–32)
Chloride: 104 mmol/L (ref 98–111)
Creatinine, Ser: 0.81 mg/dL (ref 0.44–1.00)
GFR calc non Af Amer: >60 mL/min (ref >60)
Glucose, Bld: 105 mg/dL — ABNORMAL HIGH (ref 70–99)
Potassium: 3.3 mmol/L — ABNORMAL LOW (ref 3.5–5.1)
SODIUM: 138 mmol/L (ref 135–145)

## 2018-01-20 MED ORDER — IBUPROFEN 600 MG PO TABS: 600.0000 mg | ORAL_TABLET | Freq: Four times a day (QID) | ORAL | 0 refills | Status: AC | PRN

## 2018-01-20 MED ORDER — METHOCARBAMOL 500 MG PO TABS
1000.0000 mg | ORAL_TABLET | Freq: Once | ORAL | Status: AC
  Administered 2018-01-20: 1000 mg via ORAL
  Filled 2018-01-20: qty 2

## 2018-01-20 MED ORDER — KETOROLAC TROMETHAMINE 60 MG/2ML IM SOLN
60.0000 mg | Freq: Once | INTRAMUSCULAR | Status: AC
  Administered 2018-01-20: 60 mg via INTRAMUSCULAR
  Filled 2018-01-20: qty 2

## 2018-01-20 MED ORDER — METHOCARBAMOL 500 MG PO TABS: 1000.0000 mg | ORAL_TABLET | Freq: Three times a day (TID) | ORAL | 0 refills | Status: AC | PRN

## 2018-01-20 MED ORDER — POTASSIUM CHLORIDE ER 10 MEQ PO TBCR: 10.0000 meq | EXTENDED_RELEASE_TABLET | Freq: Every day | ORAL | 0 refills | Status: DC

## 2018-01-20 NOTE — ED Notes (Signed)
Pt alert and oriented in NAD. Pt verbalized understanding of discharge instructions. 

## 2018-01-20 NOTE — ED Triage Notes (Signed)
Pt reports left sided chest and back pain that began Tuesday night, pain radiated to left arm and jaw. Denies sob or diaphoresis. States she was unable to sleep last night.

## 2018-01-20 NOTE — ED Provider Notes (Signed)
[facility] EMERGENCY DEPARTMENT Provider Note   CSN: [id number] Arrival date & time: 01/20/18  4098     History   Chief Complaint Chief Complaint  Patient presents with  . Chest Pain    HPI Nancy Tanner is a 57 y.o. female.  HPI Patient presents with 2 days of left-sided neck pain that radiates to her left shoulder and left arm as well as left side of her chest.  No known exacerbating factors.  Denies any shortness of breath.  No new lower extremity swelling.  Patient states she did have some tingling in her left fingers which has resolved.  Denies any focal weakness.  Patient complains of 1 day of bilateral low back pain which does not radiate.  No bladder or bowel incontinence.  Denies dysuria, urinary frequency or urgency.  No hematuria. Past Medical History:  Diagnosis Date  . Asthma   . GERD (gastroesophageal reflux disease) t  . History of sarcoidosis t    There are no active problems to display for this patient.   Past Surgical History:  Procedure Laterality Date  . ABDOMINAL HYSTERECTOMY  09/1999     OB History   None      Home Medications    Prior to Admission medications   Medication Sig Start Date End Date Taking? Authorizing Provider  acetaminophen (TYLENOL) 325 MG tablet Take 325-650 mg by mouth every 6 (six) hours as needed for mild pain, moderate pain or headache.    Yes [provider]  albuterol (PROVENTIL HFA;VENTOLIN HFA) 108 (90 BASE) MCG/ACT inhaler Inhale 2 puffs into the lungs every 6 (six) hours as needed for wheezing or shortness of breath.    Yes [provider]  aluminum-magnesium hydroxide 200-200 MG/5ML suspension Take 30 mLs by mouth 4 (four) times daily as needed (Severe heartburn).   Yes [provider]  azelastine (ASTELIN) 0.1 % nasal spray Place 2 sprays into both nostrils 2 (two) times daily.    Yes [provider]  buPROPion (WELLBUTRIN SR) 150 MG 12 hr tablet Take 150 mg by  mouth daily.   Yes [provider]  diclofenac sodium (VOLTAREN) 1 % GEL Apply 2 g topically 2 (two) times daily.   Yes [provider]  diphenhydrAMINE (BENADRYL) 12.5 MG/5ML elixir Take 12.5 mg by mouth 4 (four) times daily as needed for allergies.   Yes [provider]  EPINEPHrine (EPIPEN 2-PAK) 0.3 mg/0.3 mL IJ SOAJ injection Inject 0.3 mg into the muscle once.   Yes [provider]  fexofenadine (ALLEGRA) 180 MG tablet Take 180 mg by mouth daily.   Yes [provider]  FLUoxetine (PROZAC) 10 MG tablet Take 30-40 mg by mouth daily.   Yes [provider]  hydrocortisone cream 1 % Apply 1 application topically 2 (two) times daily as needed for itching.    Yes [provider]  Hypromellose (ARTIFICIAL TEARS OP) Place 1 drop into both eyes 2 (two) times daily.   Yes [provider]  ketotifen (ZADITOR) 0.025 % ophthalmic solution Place 1 drop into both eyes 2 (two) times daily.   Yes [provider]  montelukast (SINGULAIR) 10 MG tablet Take 10 mg by mouth at bedtime.   Yes [provider]  NIFEdipine (PROCARDIA-XL/ADALAT CC) 30 MG 24 hr tablet Take 30 mg by mouth daily.   Yes [provider]  omeprazole (PRILOSEC) 20 MG capsule Take 20 mg by mouth daily.   Yes [provider]  benzonatate (TESSALON) 100 MG capsule Take 1 capsule (100 mg total) by mouth every 8 (eight) hours. Patient not taking: Reported on 01/20/2018 12/10/16   Orson Slickolson, Andrew, MD  diphenhydrAMINE (BENADRYL) 12.5 MG/5ML liquid Take 25 mg by mouth daily as needed for itching or allergies.    [provider]  HYDROcodone-acetaminophen (NORCO/VICODIN) 5-325 MG per tablet Take 1 tablet by mouth every 4 (four) hours as needed for pain. Patient not taking: Reported on 01/20/2018 03/15/12   Carleene Cooperavidson, Alan, MD  ibuprofen (ADVIL,MOTRIN) 600 MG tablet Take 1 tablet (600 mg total) by mouth every 6 (six) hours as needed for moderate  pain. 01/20/18   Loren RacerYelverton, Jaxzen Vanhorn, MD  methocarbamol (ROBAXIN) 500 MG tablet Take 2 tablets (1,000 mg total) by mouth every 8 (eight) hours as needed for muscle spasms. 01/20/18   Loren RacerYelverton, Brunilda Eble, MD  potassium chloride (K-DUR) 10 MEQ tablet Take 1 tablet (10 mEq total) by mouth daily. 01/20/18   Loren RacerYelverton, Khushi Zupko, MD    Family History No family history on file.  Social History Social History   Tobacco Use  . Smoking status: Never Smoker  . Smokeless tobacco: Never Used  Substance Use Topics  . Alcohol use: No  . Drug use: No     Allergies   Norvasc [amlodipine besylate]; Shellfish allergy; Sulfa antibiotics; Amlodipine; Codeine; Codeine; Eggs or egg-derived products; Lactose intolerance (gi); Shellfish allergy; Sulfa antibiotics; and Tape   Review of Systems Review of Systems  Constitutional: Negative for chills, fatigue and fever.  HENT: Negative for sore throat and trouble swallowing.   Respiratory: Negative for cough, shortness of breath and wheezing.   Cardiovascular: Positive for chest pain. Negative for palpitations and leg swelling.  Gastrointestinal: Negative for abdominal pain, constipation, diarrhea, nausea and vomiting.  Genitourinary: Negative for dysuria, flank pain, hematuria and pelvic pain.  Musculoskeletal: Positive for back pain, myalgias and neck pain.  Skin: Negative for rash and wound.  Neurological: Positive for numbness. Negative for weakness, light-headedness and headaches.     Physical Exam Updated Vital Signs BP (!) 144/88   Pulse 71   Temp 98.4 F (36.9 C) (Oral)   Resp 20   SpO2 100%   Physical Exam  Constitutional: She is oriented to person, place, and time. She appears well-developed and well-nourished. No distress.  HENT:  Head: Normocephalic and atraumatic.  Mouth/Throat: Oropharynx is clear and moist. No oropharyngeal exudate.  Patient has mild tenderness to palpation over the left occiput.  No evidence of trauma.  Eyes: Pupils are  equal, round, and reactive to light. EOM are normal.  Neck: Normal range of motion. Neck supple.  No posterior midline cervical tenderness to palpation.  Patient does have left-sided cervical paraspinal tenderness and left trapezius tenderness with palpation.  Cardiovascular: Normal rate and regular rhythm. Exam reveals no gallop and no friction rub.  No murmur heard. Pulmonary/Chest: Effort normal and breath sounds normal. No stridor. No respiratory distress. She has no wheezes. She has no rales. She exhibits tenderness.  Left upper chest tenderness to palpation.  No crepitance or deformity.  Abdominal: Soft. Bowel sounds are normal. There is no tenderness. There is no rebound and no guarding.  Musculoskeletal: Normal range of motion. She exhibits no edema or tenderness.  No midline thoracic or lumbar tenderness.  No upper or lower extremity swelling, asymmetry or tenderness.  Distal pulses are 2+.  Neurological: She is alert and oriented to person, place, and time.  5/5 motor in all extremities.  Sensation fully intact.  Skin:  Skin is warm and dry. Capillary refill takes less than 2 seconds. No rash noted. She is not diaphoretic. No erythema.  Psychiatric: She has a normal mood and affect. Her behavior is normal.  Nursing note and vitals reviewed.    ED Treatments / Results  Labs (all labs ordered are listed, but only abnormal results are displayed) Labs Reviewed  BASIC METABOLIC PANEL - Abnormal; Notable for the following components:      Result Value   Potassium 3.3 (*)    Glucose, Bld 105 (*)    All other components within normal limits  CBC - Abnormal; Notable for the following components:   Hemoglobin 11.9 (*)    All other components within normal limits  URINALYSIS, ROUTINE W REFLEX MICROSCOPIC  I-STAT TROPONIN, ED  I-STAT BETA HCG BLOOD, ED ([facility], [facility], [facility] ONLY)    EKG EKG Interpretation  Date/Time:  Thursday January 20 2018 08:24:22 EDT Ventricular Rate:  85 PR  Interval:  146 QRS Duration: 80 QT Interval:  382 QTC Calculation: 454 R Axis:   47 Text Interpretation:  Normal sinus rhythm Normal ECG Confirmed by Loren RacerYelverton, Josaiah Muhammed ([id number]) on 01/20/2018 10:15:09 AM   Radiology Dg Chest 2 View  Result Date: 01/20/2018 CLINICAL DATA:  Chest pain, mandibular pain. Left arm pain, low back pain and nausea. History of sarcoidosis and asthma. EXAM: CHEST - 2 VIEW COMPARISON:  12/10/2016. FINDINGS: Normal sized heart. Clear lungs. Minimal thoracic spine degenerative changes. IMPRESSION: No acute abnormality. Electronically Signed   By: Beckie SaltsSteven  Reid M.D.   On: 01/20/2018 09:22    Procedures Procedures (including critical care time)  Medications Ordered in ED Medications  ketorolac (TORADOL) injection 60 mg (60 mg Intramuscular Given 01/20/18 1129)  methocarbamol (ROBAXIN) tablet 1,000 mg (1,000 mg Oral Given 01/20/18 1129)     Initial Impression / Assessment and Plan / ED Course  I have reviewed the triage vital signs and the nursing notes.  Pertinent labs & imaging results that were available during my care of the patient were reviewed by me and considered in my medical decision making (see chart for details).    Troponin is normal.  EKG without ischemic findings.  Low suspicion for ACS.  Pain is reproducible with palpation.  Suspect possible radiculopathy and muscle spasm.  Will treat symptomatically.  Advised to follow-up with her primary physician.  Will replace potassium orally.  Return precautions given.   Final Clinical Impressions(s) / ED Diagnoses   Final diagnoses:  Nonspecific chest pain  Cervical radiculopathy  Hypokalemia    ED Discharge Orders         Ordered    methocarbamol (ROBAXIN) 500 MG tablet  Every 8 hours PRN     01/20/18 1207    ibuprofen (ADVIL,MOTRIN) 600 MG tablet  Every 6 hours PRN     01/20/18 1207    potassium chloride (K-DUR) 10 MEQ tablet  Daily     01/20/18 1207           Loren RacerYelverton, Arcenia Scarbro, MD 01/20/18  1234

## 2018-02-09 IMAGING — CT CT ABD-PELV W/ CM
2 of 5 series · 15 of 46 positions shown, 17 images · IV contrast (ISOVUE)
Comparison: CT abdomen and pelvis November 11, 2015

CLINICAL DATA: Abdominal pain and bloating, constipation for 4
days. History of hysterectomy, reflux disease, sarcoidosis.

EXAM:
CT ABDOMEN AND PELVIS WITH CONTRAST
TECHNIQUE: Multidetector CT imaging of the abdomen and pelvis was performed
using the standard protocol following bolus administration of
intravenous contrast.
CONTRAST:  100 cc Isovue 300

[Series 2: abd/pel with · axial · 0.74mm/px · z∈[+1191,+1601]mm · 12 of 94 slices shown, 14 images]
[im 6/94  soft-tissue]
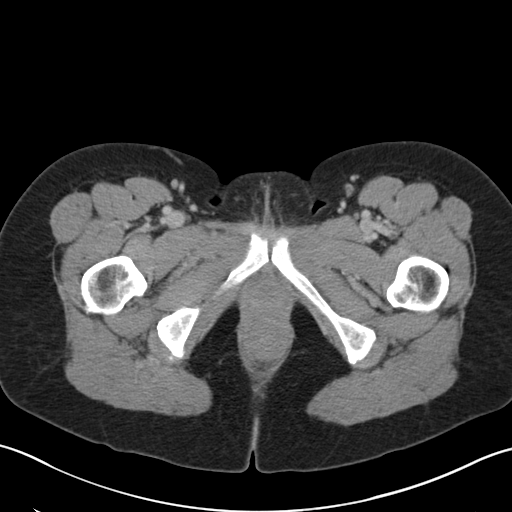
[im 6/94  bone]
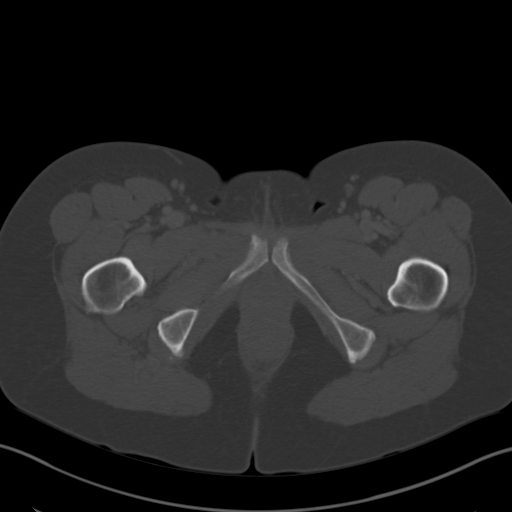
[im 17/94  soft-tissue]
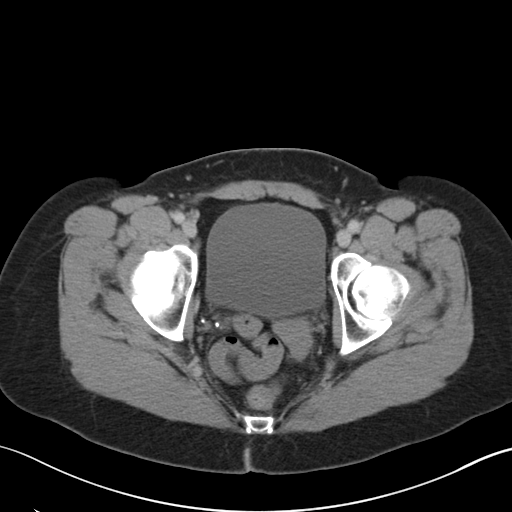
[im 22/94  soft-tissue]
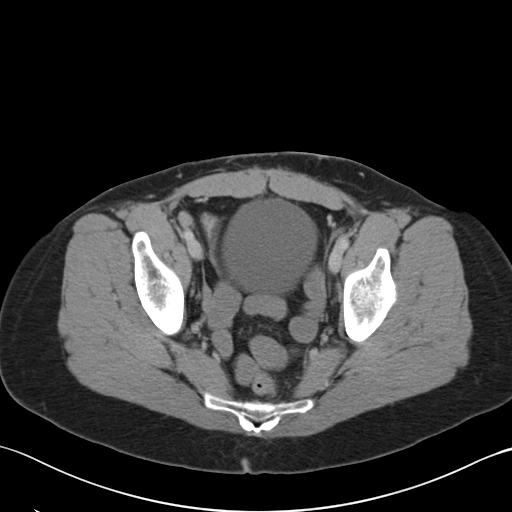
[im 28/94  soft-tissue]
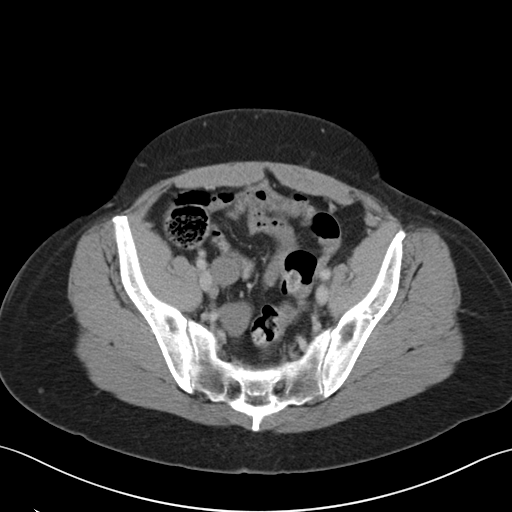
[im 39/94  soft-tissue]
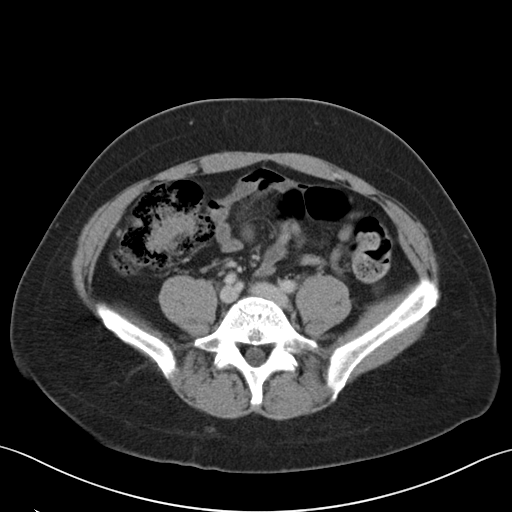
[im 44/94  soft-tissue]
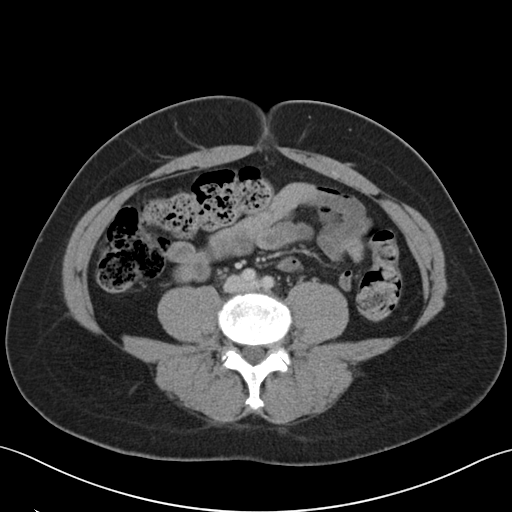
[im 50/94  soft-tissue]
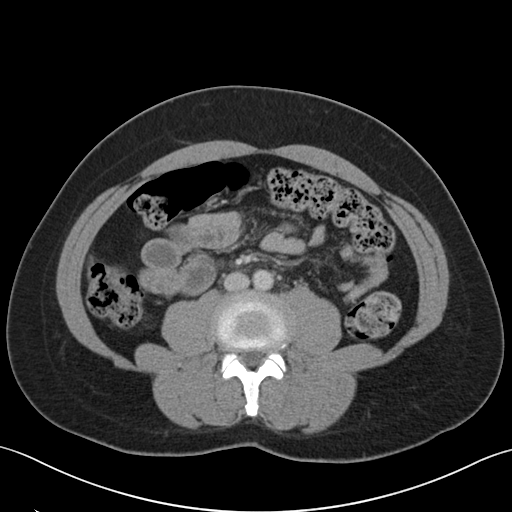
[im 61/94  soft-tissue]
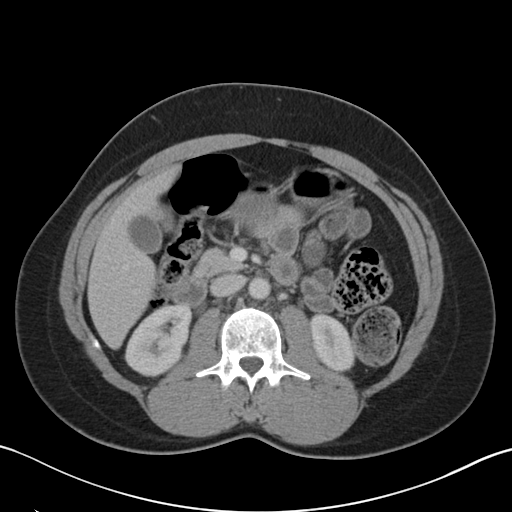
[im 66/94  soft-tissue]
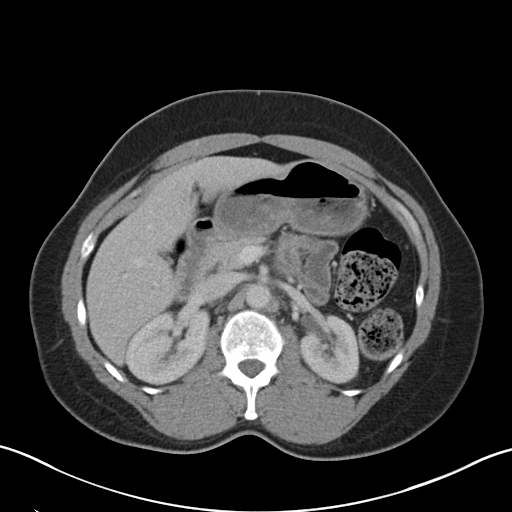
[im 66/94  bone]
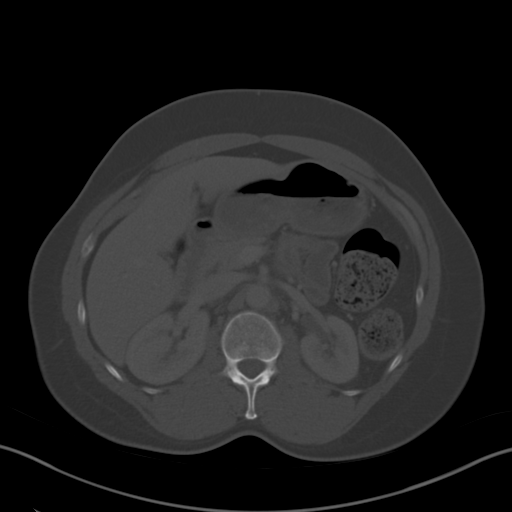
[im 72/94  soft-tissue]
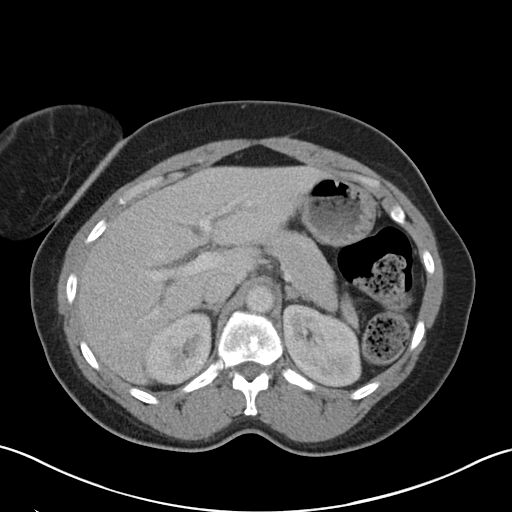
[im 83/94  soft-tissue]
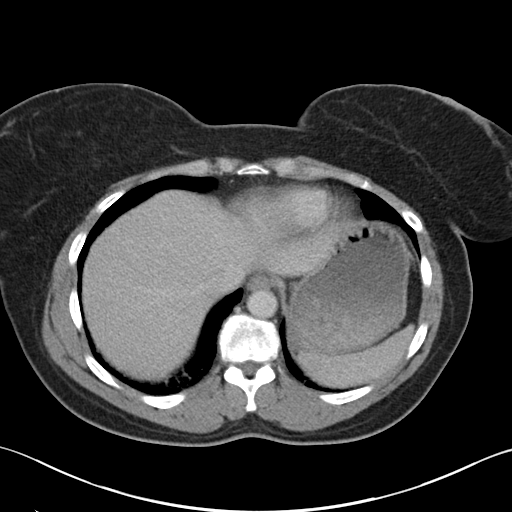
[im 88/94  soft-tissue]
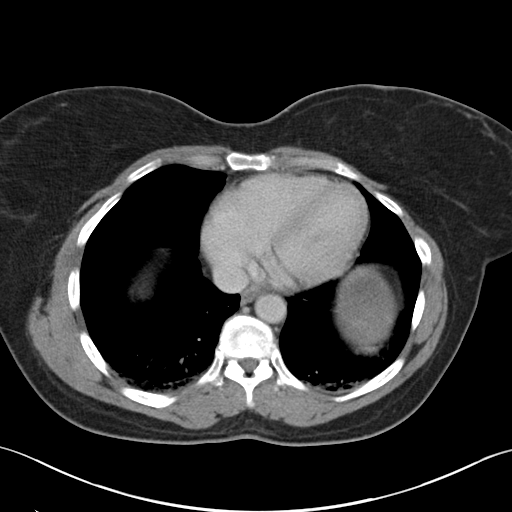

[Series 4: coronal a/|p · coronal · 0.61mm/px · 3 of 124 slices shown]
[im 42/124  soft-tissue]
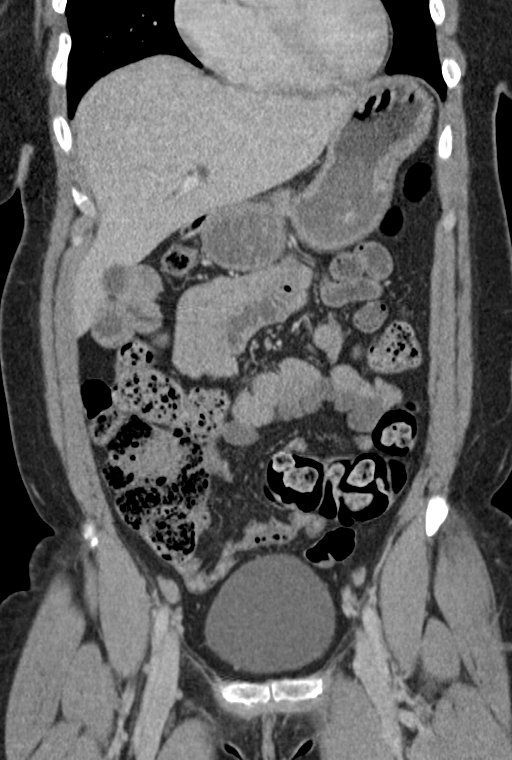
[im 55/124  soft-tissue]
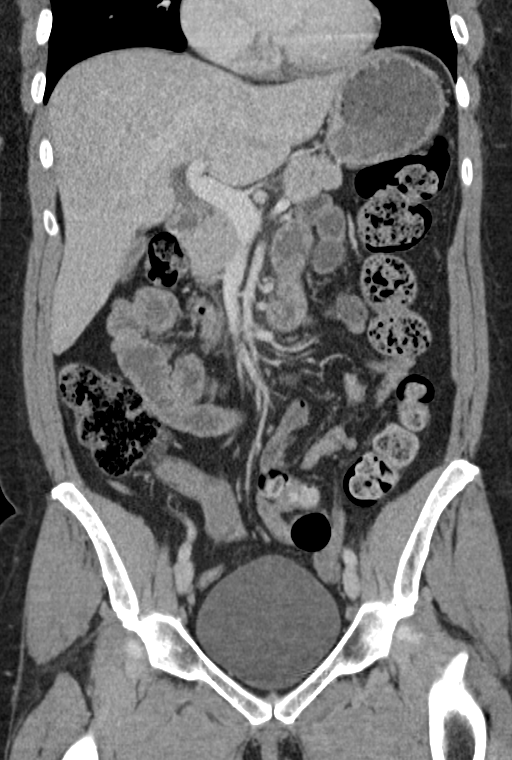
[im 69/124  soft-tissue]
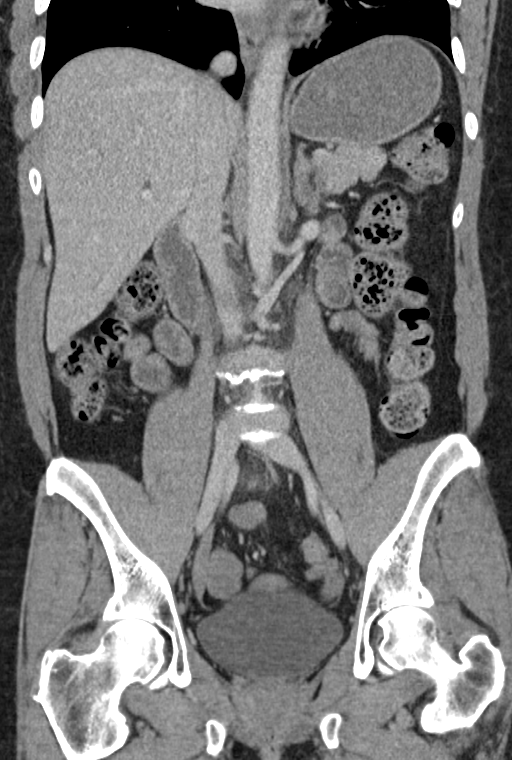

[15 of 46 positions shown; findings below may reference images not displayed]

FINDINGS: LOWER CHEST: Bibasilar dependent atelectasis. Heart size is normal.
No pericardial effusions.

HEPATOBILIARY: Trace intrahepatic biliary dilatation. Gallbladder is
normal.

PANCREAS: Normal.

SPLEEN: Normal.

ADRENALS/URINARY TRACT: Kidneys are orthotopic, demonstrating
symmetric enhancement. No nephrolithiasis, hydronephrosis or solid
renal masses. The unopacified ureters are normal in course and
caliber. Delayed imaging through the kidneys demonstrates symmetric
prompt contrast excretion within the proximal urinary collecting
system. Urinary bladder is partially distended and unremarkable.
Normal adrenal glands.

STOMACH/BOWEL: The stomach, small and large bowel are normal in
course and caliber without inflammatory changes. Moderate amount of
retained large bowel stool. Normal appendix.

VASCULAR/LYMPHATIC: Aortoiliac vessels are normal in course and
caliber. No lymphadenopathy by CT size criteria.

REPRODUCTIVE: Status post hysterectomy.

OTHER: No intraperitoneal free fluid or free air.

MUSCULOSKELETAL: Nonacute.  Anterior abdominal wall scarring.
IMPRESSION: Mild intrahepatic biliary dilatation.  Normal gallbladder.

Moderate amount of retained large bowel stool, no bowel obstruction.

## 2018-12-19 ENCOUNTER — Other Ambulatory Visit: Payer: Self-pay

## 2018-12-19 ENCOUNTER — Encounter (HOSPITAL_COMMUNITY): Payer: Self-pay | Admitting: Emergency Medicine

## 2018-12-19 ENCOUNTER — Emergency Department (HOSPITAL_COMMUNITY): Payer: No Typology Code available for payment source

## 2018-12-19 ENCOUNTER — Emergency Department (HOSPITAL_COMMUNITY)
Admission: EM | Admit: 2018-12-19 | Discharge: 2018-12-19 | Disposition: A | Discharge status: Home / Self Care | Payer: No Typology Code available for payment source | Attending: Emergency Medicine | Admitting: Emergency Medicine

## 2018-12-19 DIAGNOSIS — R0602 Shortness of breath: Secondary | ICD-10-CM | POA: Insufficient documentation

## 2018-12-19 DIAGNOSIS — R079 Chest pain, unspecified: Secondary | ICD-10-CM | POA: Diagnosis present

## 2018-12-19 DIAGNOSIS — J45909 Unspecified asthma, uncomplicated: Secondary | ICD-10-CM | POA: Insufficient documentation

## 2018-12-19 DIAGNOSIS — M549 Dorsalgia, unspecified: Secondary | ICD-10-CM | POA: Insufficient documentation

## 2018-12-19 DIAGNOSIS — Z79899 Other long term (current) drug therapy: Secondary | ICD-10-CM | POA: Diagnosis not present

## 2018-12-19 LAB — CBC
HCT: 39.6 % (ref 36.0–46.0)
Hemoglobin: 12.8 g/dL (ref 12.0–15.0)
MCH: 28.7 pg (ref 26.0–34.0)
MCHC: 32.3 g/dL (ref 30.0–36.0)
MCV: 88.8 fL (ref 80.0–100.0)
Platelets: 314 10*3/uL (ref 150–400)
RBC: 4.46 MIL/uL (ref 3.87–5.11)
RDW: 13.3 % (ref 11.5–15.5)
WBC: 7.9 10*3/uL (ref 4.0–10.5)
nRBC: 0 % (ref 0.0–0.2)

## 2018-12-19 LAB — BASIC METABOLIC PANEL
Anion gap: 12 (ref 5–15)
BUN: 10 mg/dL (ref 6–20)
CO2: 23 mmol/L (ref 22–32)
Calcium: 9.3 mg/dL (ref 8.9–10.3)
Chloride: 106 mmol/L (ref 98–111)
Creatinine, Ser: 0.94 mg/dL (ref 0.44–1.00)
GFR calc Af Amer: >60 mL/min (ref >60)
GFR calc non Af Amer: >60 mL/min (ref >60)
Glucose, Bld: 127 mg/dL — ABNORMAL HIGH (ref 70–99)
Potassium: 3.7 mmol/L (ref 3.5–5.1)
Sodium: 141 mmol/L (ref 135–145)

## 2018-12-19 LAB — TROPONIN I (HIGH SENSITIVITY): Troponin I (High Sensitivity): 2 ng/L (ref <18)

## 2018-12-19 MED ORDER — SODIUM CHLORIDE 0.9% FLUSH: 3.0000 mL | Freq: Once | INTRAVENOUS | Status: DC

## 2018-12-19 MED ORDER — KETOROLAC TROMETHAMINE 30 MG/ML IJ SOLN
30.0000 mg | Freq: Once | INTRAMUSCULAR | Status: AC
  Administered 2018-12-19: 30 mg via INTRAMUSCULAR
  Filled 2018-12-19: qty 1

## 2018-12-19 NOTE — ED Triage Notes (Signed)
Pt complains of CP that radiates to her back. Pt states she has had some nausea at times. Pain worsens when she lays down.

## 2018-12-19 NOTE — Discharge Instructions (Signed)
Please return for any problem.  Follow-up with your care provider as instructed.    Continue ibuprofen and muscle relaxants as instructed.

## 2018-12-19 NOTE — ED Provider Notes (Addendum)
[facility] Provider Note   CSN: [id number] Arrival date & time: 12/19/18  1233     History   Chief Complaint Chief Complaint  Patient presents with   Chest Pain   Shortness of Breath    HPI Nancy Tanner is a 58 y.o. female.     58 year old female with prior medical history as detailed below presents for evaluation of right-sided chest and back pain.  Patient reports onset of her discomfort over the last 4 days.  She reports that the pain is worse with lying down or with lifting.  Describes sharp discomfort to the anterior right chest and the upper right side of her back.  Pain appears to be worse with movement of her right upper extremity.  She denies shortness of breath or fever.  She denies cough.  She denies nausea or vomiting.  She reports a prior history of costochondritis and at least her anterior chest pain feels similar to this.  She has taken ibuprofen and Flexeril at home with some relief but not complete.  The history is provided by the patient and medical records.  Chest Pain Pain location:  R chest Pain quality: sharp and shooting   Pain radiates to:  Does not radiate Pain severity:  Mild Onset quality:  Sudden Timing:  Constant Chronicity:  New Context: movement   Relieved by:  Nothing Worsened by:  Nothing Associated symptoms: shortness of breath   Shortness of Breath Associated symptoms: chest pain     Past Medical History:  Diagnosis Date   Asthma    GERD (gastroesophageal reflux disease) t   History of sarcoidosis t    There are no active problems to display for this patient.   Past Surgical History:  Procedure Laterality Date   ABDOMINAL HYSTERECTOMY  09/1999     OB History   No obstetric history on file.      Home Medications    Prior to Admission medications   Medication Sig Start Date End Date Taking? Authorizing Provider  acetaminophen (TYLENOL) 325 MG tablet Take 325-650 mg by mouth  every 6 (six) hours as needed for mild pain, moderate pain or headache.    Yes [provider]  albuterol (PROVENTIL HFA;VENTOLIN HFA) 108 (90 BASE) MCG/ACT inhaler Inhale 2 puffs into the lungs every 6 (six) hours as needed for wheezing or shortness of breath.    Yes [provider]  aluminum-magnesium hydroxide 200-200 MG/5ML suspension Take 30 mLs by mouth 4 (four) times daily as needed (Severe heartburn).   Yes [provider]  azelastine (ASTELIN) 0.1 % nasal spray Place 2 sprays into both nostrils 2 (two) times daily.    Yes [provider]  cyclobenzaprine (FLEXERIL) 10 MG tablet Take 10 mg by mouth 3 (three) times daily as needed for muscle spasms.   Yes [provider]  diphenhydrAMINE (BENADRYL) 12.5 MG/5ML elixir Take 12.5 mg by mouth 4 (four) times daily as needed for allergies.   Yes [provider]  EPINEPHrine (EPIPEN 2-PAK) 0.3 mg/0.3 mL IJ SOAJ injection Inject 0.3 mg into the muscle once.   Yes [provider]  fexofenadine (ALLEGRA) 180 MG tablet Take 180 mg by mouth daily.   Yes [provider]  FLUoxetine (PROZAC) 10 MG tablet Take 30-40 mg by mouth daily.   Yes [provider]  HYDROcodone-acetaminophen (NORCO/VICODIN) 5-325 MG per tablet Take 1 tablet by mouth every 4 (four) hours as needed for pain. 03/15/12  Yes  Carleene Cooperavidson, Alan, MD  hydrocortisone cream 1 % Apply 1 application topically 2 (two) times daily as needed for itching.    Yes [provider]  hydrOXYzine (ATARAX/VISTARIL) 25 MG tablet Take 25 mg by mouth 3 (three) times daily as needed for itching.   Yes [provider]  Hypromellose (ARTIFICIAL TEARS OP) Place 1 drop into both eyes 2 (two) times daily.   Yes [provider]  ibuprofen (ADVIL,MOTRIN) 600 MG tablet Take 1 tablet (600 mg total) by mouth every 6 (six) hours as needed for moderate pain. 01/20/18  Yes Loren RacerYelverton, David, MD  ketotifen (ZADITOR) 0.025 %  ophthalmic solution Place 1 drop into both eyes 2 (two) times daily.   Yes [provider]  montelukast (SINGULAIR) 10 MG tablet Take 10 mg by mouth at bedtime as needed (allergies).    Yes [provider]  NIFEdipine (PROCARDIA-XL/ADALAT CC) 30 MG 24 hr tablet Take 30 mg by mouth daily.   Yes [provider]  omeprazole (PRILOSEC) 20 MG capsule Take 20 mg by mouth daily.   Yes [provider]  methocarbamol (ROBAXIN) 500 MG tablet Take 2 tablets (1,000 mg total) by mouth every 8 (eight) hours as needed for muscle spasms. Patient not taking: Reported on 12/19/2018 01/20/18   Loren RacerYelverton, David, MD    Family History No family history on file.  Social History Social History   Tobacco Use   Smoking status: Never Smoker   Smokeless tobacco: Never Used  Substance Use Topics   Alcohol use: No   Drug use: No     Allergies   Norvasc [amlodipine besylate], Shellfish allergy, Sulfa antibiotics, Codeine, Eggs or egg-derived products, Lactose intolerance (gi), and Tape   Review of Systems Review of Systems  Respiratory: Positive for shortness of breath.   Cardiovascular: Positive for chest pain.  All other systems reviewed and are negative.    Physical Exam Updated Vital Signs BP 127/81    Pulse 85    Temp 98.2 F (36.8 C) (Oral)    Resp 14    Ht 5\' 6"  (1.676 m)    Wt 81.6 kg    SpO2 96%    BMI 29.05 kg/m   Physical Exam Vitals signs and nursing note reviewed.  Constitutional:      General: She is not in acute distress.    Appearance: She is well-developed.  HENT:     Head: Normocephalic and atraumatic.  Eyes:     Conjunctiva/sclera: Conjunctivae normal.     Pupils: Pupils are equal, round, and reactive to light.  Neck:     Musculoskeletal: Normal range of motion and neck supple.  Cardiovascular:     Rate and Rhythm: Normal rate and regular rhythm.     Heart sounds: Normal heart sounds.  Pulmonary:     Effort: Pulmonary effort is  normal. No respiratory distress.     Breath sounds: Normal breath sounds.  Abdominal:     General: There is no distension.     Palpations: Abdomen is soft.     Tenderness: There is no abdominal tenderness.  Musculoskeletal: Normal range of motion.        General: No deformity.  Skin:    General: Skin is warm and dry.  Neurological:     Mental Status: She is alert and oriented to person, place, and time.      ED Treatments / Results  Labs (all labs ordered are listed, but only abnormal results are displayed) Labs Reviewed  BASIC  METABOLIC PANEL - Abnormal; Notable for the following components:      Result Value   Glucose, Bld 127 (*)    All other components within normal limits  CBC  I-STAT BETA HCG BLOOD, ED ([facility], [facility], [facility] ONLY)  TROPONIN I (HIGH SENSITIVITY)    EKG EKG Interpretation  Date/Time:  Monday December 19 2018 12:45:42 EDT Ventricular Rate:  103 PR Interval:  140 QRS Duration: 74 QT Interval:  340 QTC Calculation: 445 R Axis:   51 Text Interpretation:  Sinus tachycardia Otherwise normal ECG Confirmed by Dene Gentry [phone]) on 12/19/2018 1:06:06 PM Also confirmed by Dene Gentry [phone]), editor Philomena Doheny [phone])  on 12/19/2018 1:54:32 PM   Radiology Dg Chest 2 View  Result Date: 12/19/2018 CLINICAL DATA:  Chest pain, back pain and shortness of breath. History of sarcoidosis. EXAM: CHEST - 2 VIEW COMPARISON:  01/20/2018 FINDINGS: Cardiomediastinal silhouette is normal. Mediastinal contours appear intact. There is no evidence of focal airspace consolidation, pleural effusion or pneumothorax. Osseous structures are without acute abnormality. Soft tissues are grossly normal. IMPRESSION: No active cardiopulmonary disease. Electronically Signed   By: Fidela Salisbury M.D.   On: 12/19/2018 13:09    Procedures Procedures (including critical care time)  Medications Ordered in ED Medications  sodium chloride flush (NS) 0.9 % injection 3 mL (3 mLs Intravenous  Not Given 12/19/18 1353)  ketorolac (TORADOL) 30 MG/ML injection 30 mg (30 mg Intramuscular Given 12/19/18 1348)     Initial Impression / Assessment and Plan / ED Course  I have reviewed the triage vital signs and the nursing notes.  Pertinent labs & imaging results that were available during my care of the patient were reviewed by me and considered in my medical decision making (see chart for details).        MDM  Screen complete  Nancy Tanner was evaluated in Emergency Department on 12/19/2018 for the symptoms described in the history of present illness. She was evaluated in the context of the global COVID-19 pandemic, which necessitated consideration that the patient might be at risk for infection with the SARS-CoV-2 virus that causes COVID-19. Institutional protocols and algorithms that pertain to the evaluation of patients at risk for COVID-19 are in a state of rapid change based on information released by regulatory bodies including the CDC and federal and state organizations. These policies and algorithms were followed during the patient's care in the ED.  Patient is presenting for evaluation of right-sided chest and back pain.  Patient's described symptoms are consistent with likely musculoskeletal discomfort.  Describe symptoms are not consistent with ACS.  EKG is without evidence of acute ischemia.  Initial troponin is 2.  Her symptoms have been present for at least 3 days.  Other screening labs are without significant abnormality.  Patient appears to be appropriate for discharge.  She feels improved following her ED evaluation.  Strict return precautions given and understood.  Importance of close follow-up is stressed.  Final Clinical Impressions(s) / ED Diagnoses   Final diagnoses:  Musculoskeletal back pain    ED Discharge Orders    None       Valarie Merino, MD 12/19/18 1440    Valarie Merino, MD 12/19/18 1456

## 2018-12-19 NOTE — ED Notes (Signed)
Patient verbalizes understanding of discharge instructions. Opportunity for questioning and answers were provided. Armband removed by staff, pt discharged from ED.  

## 2021-08-22 ENCOUNTER — Emergency Department (HOSPITAL_COMMUNITY): Payer: Non-veteran care

## 2021-08-22 ENCOUNTER — Other Ambulatory Visit: Payer: Self-pay

## 2021-08-22 ENCOUNTER — Encounter (HOSPITAL_COMMUNITY): Payer: Self-pay | Admitting: Emergency Medicine

## 2021-08-22 ENCOUNTER — Emergency Department (HOSPITAL_COMMUNITY)
Admission: EM | Admit: 2021-08-22 | Discharge: 2021-08-22 | Disposition: A | Discharge status: Home / Self Care | Payer: Non-veteran care | Attending: Emergency Medicine | Admitting: Emergency Medicine

## 2021-08-22 DIAGNOSIS — S42291A Other displaced fracture of upper end of right humerus, initial encounter for closed fracture: Secondary | ICD-10-CM

## 2021-08-22 DIAGNOSIS — W010XXA Fall on same level from slipping, tripping and stumbling without subsequent striking against object, initial encounter: Secondary | ICD-10-CM | POA: Diagnosis not present

## 2021-08-22 DIAGNOSIS — S42251A Displaced fracture of greater tuberosity of right humerus, initial encounter for closed fracture: Secondary | ICD-10-CM | POA: Diagnosis not present

## 2021-08-22 DIAGNOSIS — S4991XA Unspecified injury of right shoulder and upper arm, initial encounter: Secondary | ICD-10-CM | POA: Diagnosis present

## 2021-08-22 DIAGNOSIS — J45909 Unspecified asthma, uncomplicated: Secondary | ICD-10-CM | POA: Diagnosis not present

## 2021-08-22 MED ORDER — HYDROMORPHONE HCL 1 MG/ML IJ SOLN
1.0000 mg | Freq: Once | INTRAMUSCULAR | Status: AC
  Administered 2021-08-22: 1 mg via INTRAVENOUS
  Filled 2021-08-22: qty 1

## 2021-08-22 MED ORDER — OXYCODONE-ACETAMINOPHEN 5-325 MG PO TABS: 2.0000 | ORAL_TABLET | Freq: Four times a day (QID) | ORAL | 0 refills | Status: AC | PRN

## 2021-08-22 MED ORDER — OXYCODONE-ACETAMINOPHEN 5-325 MG PO TABS
2.0000 | ORAL_TABLET | Freq: Once | ORAL | Status: AC
  Administered 2021-08-22: 2 via ORAL
  Filled 2021-08-22: qty 2

## 2021-08-22 NOTE — ED Triage Notes (Signed)
Per EMS- patient had a fall today on a carpeted floor and tried to break the fall with her right arm Patient c/o right wrist pain that radiates into the right shoulder. ?

## 2021-08-22 NOTE — ED Notes (Signed)
Patient transported to X-ray 

## 2021-08-22 NOTE — ED Provider Notes (Signed)
?[facility]-EMERGENCY DEPT ?Provider Note ? ? ?CSN: [id number] ?Arrival date & time: 08/22/21  1721 ? ?  ? ?History ? ?Chief Complaint  ?Patient presents with  ? Fall  ? Arm Injury  ? ? ?Nancy Tanner is a 61 y.o. female. ? ?HPI ?61 year old female with history of GERD, asthma, history of sarcoid presents to the ER after a mechanical fall onto carpeted floor.  She tried to break her fall with her right arm.  She complains of right wrist pain that radiates to the right shoulder.  Patient arrived in significant pain, difficult to assess due to tenderness.  She denies any chest pain, dizziness, shortness of breath headache at the time of the fall.  She did not hit her head or lose consciousness. States she tripped over some carpet. ?  ? ?Home Medications ?Prior to Admission medications   ?Medication Sig Start Date End Date Taking? Authorizing Provider  ?oxyCODONE-acetaminophen (PERCOCET/ROXICET) 5-325 MG tablet Take 2 tablets by mouth every 6 (six) hours as needed for up to 5 days for severe pain. 08/22/21 08/27/21 Yes Mare Ferrari, PA-C  ?acetaminophen (TYLENOL) 325 MG tablet Take 325-650 mg by mouth every 6 (six) hours as needed for mild pain, moderate pain or headache.     [provider]  ?albuterol (PROVENTIL HFA;VENTOLIN HFA) 108 (90 BASE) MCG/ACT inhaler Inhale 2 puffs into the lungs every 6 (six) hours as needed for wheezing or shortness of breath.     [provider]  ?aluminum-magnesium hydroxide 200-200 MG/5ML suspension Take 30 mLs by mouth 4 (four) times daily as needed (Severe heartburn).    [provider]  ?azelastine (ASTELIN) 0.1 % nasal spray Place 2 sprays into both nostrils 2 (two) times daily.     [provider]  ?cyclobenzaprine (FLEXERIL) 10 MG tablet Take 10 mg by mouth 3 (three) times daily as needed for muscle spasms.    [provider]  ?diphenhydrAMINE (BENADRYL) 12.5 MG/5ML elixir Take 12.5 mg by mouth 4 (four) times daily as  needed for allergies.    [provider]  ?EPINEPHrine (EPIPEN 2-PAK) 0.3 mg/0.3 mL IJ SOAJ injection Inject 0.3 mg into the muscle once.    [provider]  ?fexofenadine (ALLEGRA) 180 MG tablet Take 180 mg by mouth daily.    [provider]  ?FLUoxetine (PROZAC) 10 MG tablet Take 30-40 mg by mouth daily.    [provider]  ?HYDROcodone-acetaminophen (NORCO/VICODIN) 5-325 MG per tablet Take 1 tablet by mouth every 4 (four) hours as needed for pain. 03/15/12   Carleene Cooper, MD  ?hydrocortisone cream 1 % Apply 1 application topically 2 (two) times daily as needed for itching.     [provider]  ?hydrOXYzine (ATARAX/VISTARIL) 25 MG tablet Take 25 mg by mouth 3 (three) times daily as needed for itching.    [provider]  ?Hypromellose (ARTIFICIAL TEARS OP) Place 1 drop into both eyes 2 (two) times daily.    [provider]  ?ibuprofen (ADVIL,MOTRIN) 600 MG tablet Take 1 tablet (600 mg total) by mouth every 6 (six) hours as needed for moderate pain. 01/20/18   Loren Racer, MD  ?ketotifen (ZADITOR) 0.025 % ophthalmic solution Place 1 drop into both eyes 2 (two) times daily.    [provider]  ?methocarbamol (ROBAXIN) 500 MG tablet Take 2 tablets (1,000 mg total) by mouth every 8 (eight) hours as needed for muscle spasms. ?Patient not taking: Reported on 12/19/2018 01/20/18   Loren Racer,  MD  ?montelukast (SINGULAIR) 10 MG tablet Take 10 mg by mouth at bedtime as needed (allergies).     [provider]  ?NIFEdipine (PROCARDIA-XL/ADALAT CC) 30 MG 24 hr tablet Take 30 mg by mouth daily.    [provider]  ?omeprazole (PRILOSEC) 20 MG capsule Take 20 mg by mouth daily.    [provider]  ?   ? ?Allergies    ?Norvasc [amlodipine besylate], Shellfish allergy, Sulfa antibiotics, Codeine, Eggs or egg-derived products, Lactose intolerance (gi), and Tape   ? ?Review of Systems   ?Review of Systems ?Ten systems  reviewed and are negative for acute change, except as noted in the HPI.  ? ? ?Physical Exam ?Updated Vital Signs ?BP (!) 151/99   Pulse 78   Temp 98.4 ?F (36.9 ?C) (Oral)   Resp 18   SpO2 93%  ?Physical Exam ?Vitals and nursing note reviewed.  ?Constitutional:   ?   General: She is not in acute distress. ?   Appearance: She is well-developed.  ?HENT:  ?   Head: Normocephalic and atraumatic.  ?Eyes:  ?   Conjunctiva/sclera: Conjunctivae normal.  ?Cardiovascular:  ?   Rate and Rhythm: Normal rate and regular rhythm.  ?   Heart sounds: No murmur heard. ?Pulmonary:  ?   Effort: Pulmonary effort is normal. No respiratory distress.  ?   Breath sounds: Normal breath sounds.  ?Abdominal:  ?   Palpations: Abdomen is soft.  ?   Tenderness: There is no abdominal tenderness.  ?Musculoskeletal:     ?   General: Tenderness present. No swelling or deformity.  ?   Cervical back: Neck supple.  ?   Comments: Limited exam secondary to pain.  Patient arrived in a makeshift sling made by EMS.  No visible step-offs to the right shoulder.  She is tender to palpation to the right shoulder, right elbow and right wrist.  Unable to check range of motion secondary to pain.  2+ radial pulses.  Sensations intact.  No evidence of open fractures.  ?Skin: ?   General: Skin is warm and dry.  ?   Capillary Refill: Capillary refill takes less than 2 seconds.  ?   Findings: No erythema.  ?Neurological:  ?   Mental Status: She is alert.  ?Psychiatric:     ?   Mood and Affect: Mood normal.  ? ? ?ED Results / Procedures / Treatments   ?Labs ?(all labs ordered are listed, but only abnormal results are displayed) ?Labs Reviewed - No data to display ? ?EKG ?None ? ?Radiology ?DG Shoulder Right ? ?Result Date: 08/22/2021 ?CLINICAL DATA:  Fall.  Tried to break fall with right arm. EXAM: RIGHT SHOULDER - 2+ VIEW; RIGHT HUMERUS - 2+ VIEW COMPARISON:  None. FINDINGS: The technologist reports the patient only allowed one view of the right humerus. There are two  views of the right shoulder. There is an acute fracture of the right humeral surgical neck with up to 13 mm lateral displacement of some of the lateral humeral head cortical fragments on frontal view. A curvilinear fracture lucency extends throughout the medial and lateral aspect of the proximal humeral surgical neck. Fracture lucencies are also seen throughout the superior aspect of the greater tuberosity. The humeral head remains appropriately located with respect of the glenoid fossa. Mild to moderate acromioclavicular joint space narrowing and peripheral osteophytosis. No acute fracture is seen within the more distal aspect of the humerus. IMPRESSION: Acute, displaced fracture of the proximal humerus  with lateral displacement of the greater tuberosity with respect to the humeral shaft and nondisplaced fracture lines within the humeral surgical neck. Electronically Signed   By: Neita Garnetonald  Viola M.D.   On: 08/22/2021 18:29  ? ?DG Elbow Complete Right ? ?Result Date: 08/22/2021 ?CLINICAL DATA:  Fall on outstretched arm, right elbow pain EXAM: RIGHT ELBOW - COMPLETE 3+ VIEW COMPARISON:  None. FINDINGS: There is no evidence of fracture, dislocation, or joint effusion. There is no evidence of arthropathy or other focal bone abnormality. Soft tissues are unremarkable. IMPRESSION: Negative. Electronically Signed   By: Helyn NumbersAshesh  Parikh M.D.   On: 08/22/2021 19:39  ? ?DG Forearm Right ? ?Result Date: 08/22/2021 ?CLINICAL DATA:  Fall on outstretched arm, right arm pain EXAM: RIGHT FOREARM - 2 VIEW COMPARISON:  None. FINDINGS: There is no evidence of fracture or other focal bone lesions. Soft tissues are unremarkable. IMPRESSION: Negative. Electronically Signed   By: Helyn NumbersAshesh  Parikh M.D.   On: 08/22/2021 19:38  ? ?DG Wrist Complete Right ? ?Result Date: 08/22/2021 ?CLINICAL DATA:  Fall onto carpeted floor.  Right wrist pain. EXAM: RIGHT WRIST - COMPLETE 3+ VIEW COMPARISON:  None. FINDINGS: There is no evidence of fracture or  dislocation. There is no evidence of arthropathy or other focal bone abnormality. Soft tissues are unremarkable. IMPRESSION: Negative. Electronically Signed   By: Larose HiresImran  Ahmed D.O.   On: 08/22/2021 19:37  ? ?DG [place]

## 2021-08-22 NOTE — Discharge Instructions (Signed)
Your x-rays today show that you have a proximal humeral head fracture which is a fracture of the bone that connects to the shoulder joint.  Your other x-rays were overall reassuring.  Please make sure to wear your splint at all times and limited motion.  You will need prompt orthopedic follow-up, I have provided Dr. Kathline Magic phone number in your discharge paperwork.  Please call that number and schedule an appointment as soon as possible.  I have also sent a prescription for Percocet.  Do not take additional Tylenol with this medication, take at night and do not drink or drive on this medication.  You may also take ibuprofen for pain.  Please return to the ER for any new or worsening symptoms. ?

## 2022-07-09 HISTORY — PX: ANTERIOR FUSION CERVICAL SPINE: SUR626

## 2022-11-11 ENCOUNTER — Ambulatory Visit (INDEPENDENT_AMBULATORY_CARE_PROVIDER_SITE_OTHER): Payer: No Typology Code available for payment source | Admitting: Podiatry

## 2022-11-11 ENCOUNTER — Encounter: Payer: Self-pay | Admitting: Podiatry

## 2022-11-11 DIAGNOSIS — M21611 Bunion of right foot: Secondary | ICD-10-CM

## 2022-11-11 DIAGNOSIS — E1142 Type 2 diabetes mellitus with diabetic polyneuropathy: Secondary | ICD-10-CM

## 2022-11-11 NOTE — Progress Notes (Signed)
  Subjective:  Patient ID: Linna Caprice, female    DOB: [birth year],   MRN: [id number]  Chief Complaint  Patient presents with   Callouses    Bilateral callouses. Pain in the right foot patient states she is a diabetic     62 y.o. female presents for concern of bilateral calluses that have improved. Was concerned about her feet still.   Requesting to have them trimmed today. Relates burning and tingling in their feet. Patient is diabetic and last A1c was  Lab Results  Component Value Date   HGBA1C 6.3 (H) 01/12/2011   .   PCP:  Wendi Maya, MD    . Denies any other pedal complaints. Denies n/v/f/c.   Past Medical History:  Diagnosis Date   Asthma    GERD (gastroesophageal reflux disease) t   History of sarcoidosis t    Objective:  Physical Exam: Vascular: DP/PT pulses 2/4 bilateral. CFT <3 seconds. Absent hair growth on digits. Edema noted to bilateral lower extremities. Xerosis noted bilaterally.  Skin. No lacerations or abrasions bilateral feet. Nails 1-5 bilateral  are normal in appearance Musculoskeletal: MMT 5/5 bilateral lower extremities in DF, PF, Inversion and Eversion. Deceased ROM in DF of ankle joint. 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  Neurological: Sensation intact to light touch. Protective sensation diminished bilateral.    Assessment:   1. Type 2 diabetes mellitus with diabetic polyneuropathy, without long-term current use of insulin (HCC)   2. Bunion, right      Plan:  Patient was evaluated and treated and all questions answered. -Discussed and educated patient on diabetic foot care, especially with  regards to the vascular, neurological and musculoskeletal systems.  -Stressed the importance of good glycemic control and the detriment of not  controlling glucose levels in relation to the foot. -Discussed supportive shoes at all times and checking feet regularly.  -Wil get fitted for DM shoes  -Answered all patient questions -Patient to return   in 1 year for DM foot check  -Patient advised to call the office if any problems or questions arise in the meantime.   Louann Sjogren, DPM

## 2022-11-16 ENCOUNTER — Ambulatory Visit (INDEPENDENT_AMBULATORY_CARE_PROVIDER_SITE_OTHER): Payer: No Typology Code available for payment source | Admitting: Podiatry

## 2022-11-16 DIAGNOSIS — M21611 Bunion of right foot: Secondary | ICD-10-CM

## 2022-11-16 DIAGNOSIS — E1142 Type 2 diabetes mellitus with diabetic polyneuropathy: Secondary | ICD-10-CM

## 2022-11-16 NOTE — Progress Notes (Signed)
Patient presents to the office today for diabetic shoe and insole measuring.  Patient was measured with brannock device to determine size and width for 1 pair of extra depth shoes and foam casted for 3 pair of insoles.   ABN signed.   Documentation of medical necessity will be sent to patient's treating diabetic doctor to verify and sign.   Patient's diabetic provider: Idalia Needle  Shoes and insoles will be ordered at that time and patient will be notified for an appointment for fitting when they arrive.   Brannock measurement: 9.5 M  Patient shoe selection-   1st   Shoe choice:   988 - [employer] [place]  2nd  Shoe choice:   896 - [employer]et [place]  Shoe size ordered: 9.5 M

## 2023-01-07 NOTE — Progress Notes (Signed)
Spoke with Nancy Tanner at the [facility] they received the documentation for the patient to get diabetic shoes, we are not permitted to provide the shoes to the patient, they will contact the patient and send them to a authorized vendor.

## 2023-11-04 ENCOUNTER — Encounter: Payer: Self-pay | Admitting: Neurology

## 2023-11-04 ENCOUNTER — Ambulatory Visit (INDEPENDENT_AMBULATORY_CARE_PROVIDER_SITE_OTHER): Admitting: Neurology

## 2023-11-04 VITALS — BP 123/84 | HR 99 | Ht 66.0 in | Wt 185.0 lb

## 2023-11-04 DIAGNOSIS — G478 Other sleep disorders: Secondary | ICD-10-CM

## 2023-11-04 DIAGNOSIS — R351 Nocturia: Secondary | ICD-10-CM

## 2023-11-04 DIAGNOSIS — F5104 Psychophysiologic insomnia: Secondary | ICD-10-CM

## 2023-11-04 DIAGNOSIS — Z789 Other specified health status: Secondary | ICD-10-CM

## 2023-11-04 DIAGNOSIS — G4733 Obstructive sleep apnea (adult) (pediatric): Secondary | ICD-10-CM

## 2023-11-04 DIAGNOSIS — D860 Sarcoidosis of lung: Secondary | ICD-10-CM

## 2023-11-04 MED ORDER — ALPRAZOLAM 0.5 MG PO TABS: 0.5000 mg | ORAL_TABLET | Freq: Every evening | ORAL | 0 refills | Status: AC | PRN

## 2023-11-04 NOTE — Patient Instructions (Signed)
 Living With Sleep Apnea Sleep apnea is a condition that affects your breathing while you're sleeping. Your tongue or the tissue in your throat may block the flow of air while you sleep. You may have shallow breathing or stop breathing for short periods of time. The breaks in breathing interrupt the deep sleep that you need to feel rested. Even if you don't wake up from the gaps in breathing, you may feel tired during the day. People with sleep apnea may snore loudly. You may have a headache in the morning and feel anxious or depressed. How can sleep apnea affect me? Sleep apnea increases your chances of being very tired during the day. This is called daytime fatigue. Sleep apnea can also increase your risk of: Heart attack. Stroke. Obesity. Type 2 diabetes. Heart failure. Irregular heartbeat. High blood pressure. If you are very tired during the day, you may be more likely to: Not do well in school or at work. Fall asleep while driving. Have trouble paying attention. Develop depression or anxiety. Have problems having sex. This is called sexual dysfunction. What actions can I take to manage sleep apnea? Sleep apnea treatment  If you were given a device to open your airway while you sleep, use it only as told by your health care provider. You may be given: An oral appliance. This is a mouthpiece that shifts your lower jaw forward. A continuous positive airway pressure (CPAP) device. This blows air through a mask. A nasal expiratory positive airway pressure (EPAP) device. This has valves that you put into each nostril. A bi-level positive airway pressure (BIPAP) device. This blows air through a mask when you breathe in and breathe out. You may need surgery if other treatments don't work for you. Sleep habits Go to sleep and wake up at the same time every day. This helps set your internal clock for sleeping. If you stay up later than usual on weekends, try to get up in the morning within 2  hours of the time you usually wake up. Try to get at least 7-9 hours of sleep each night. Stop using a computer, tablet, and mobile phone a few hours before bedtime. Do not take long naps during the day. If you nap, limit it to 30 minutes. Have a relaxing bedtime routine. Reading or listening to music may relax you and help you sleep. Use your bedroom only for sleep. Keep your television and computer out of your bedroom. Keep your bedroom cool, dark, and quiet. Use a supportive mattress and pillows. Follow your provider's instructions for other changes to sleep habits. Nutrition Do not eat big meals in the evening. Do not have caffeine in the later part of the day. The effects of caffeine can last for more than 5 hours. Follow your provider's instructions for any changes to what you eat and drink. Lifestyle Do not drink alcohol before bedtime. Alcohol can cause you to fall asleep at first, but then it can cause you to wake up in the middle of the night and have trouble getting back to sleep. Do not smoke, vape, or use nicotine or tobacco. Medicines Take over-the-counter and prescription medicines only as told by your provider. Do not use over-the-counter sleep medicine. You may become dependent on this medicine, and it can make sleep apnea worse. Do not take medicines, such as sedatives and narcotics, unless told to by your provider. Activity Exercise on most days, but avoid exercising in the evening. Exercising near bedtime can interfere with sleeping.  If possible, spend time outside every day. Natural light helps with your internal clock. General information Lose weight if you need to. Stay at a healthy weight. If you are having surgery, make sure to tell your provider that you have sleep apnea. You may need to bring your device with you. Keep all follow-up visits. Your provider will want to check on your condition. Where to find more information National Heart, Lung, and Blood  Institute: [employer] This information is not intended to replace advice given to you by your health care provider. Make sure you discuss any questions you have with your health care provider. Document Revised: 09/16/2022 Document Reviewed: 09/16/2022 Elsevier Patient Education  2024 [employer]Quality Sleep Information, Adult Quality sleep is important for your mental and physical health. It also improves your quality of life. Quality sleep means you: Are asleep for most of the time you are in bed. Fall asleep within 30 minutes. Wake up no more than once a night. Are awake for no longer than 20 minutes if you do wake up during the night. Most adults need 7-8 hours of quality sleep each night. How can poor sleep affect me? If you do not get enough quality sleep, you may have: Mood swings. Daytime sleepiness. Decreased alertness, reaction time, and concentration. Sleep disorders, such as insomnia and sleep apnea. Difficulty with: Solving problems. Coping with stress. Paying attention. These issues may affect your performance and productivity at work, school, and home. Lack of sleep may also put you at higher risk for accidents, suicide, and risky behaviors. If you do not get quality sleep, you may also be at higher risk for several health problems, including: Infections. Type 2 diabetes. Heart disease. High blood pressure. Obesity. Worsening of long-term conditions, like arthritis, kidney disease, depression, Parkinson's disease, and epilepsy. What actions can I take to get more quality sleep? Sleep schedule and routine Stick to a sleep schedule. Go to sleep and wake up at about the same time each day. Do not try to sleep less on weekdays and make up for lost sleep on weekends. This does not work. Limit naps during the day to 30 minutes or less. Do not take naps in the late afternoon. Make time to relax before bed. Reading, listening to music, or taking a hot bath promotes  quality sleep. Make your bedroom a place that promotes quality sleep. Keep your bedroom dark, quiet, and at a comfortable room temperature. Make sure your bed is comfortable. Avoid using electronic devices that give off bright blue light for 30 minutes before bedtime. Your brain perceives bright blue light as sunlight. This includes television, phones, and computers. If you are lying awake in bed for longer than 20 minutes, get up and do a relaxing activity until you feel sleepy. Lifestyle     Try to get at least 30 minutes of exercise on most days. Do not exercise 2-3 hours before going to bed. Do not use any products that contain nicotine or tobacco. These products include cigarettes, chewing tobacco, and vaping devices, such as e-cigarettes. If you need help quitting, ask your health care provider. Do not drink caffeinated beverages for at least 8 hours before going to bed. Coffee, tea, and some sodas contain caffeine. Do not drink alcohol or eat large meals close to bedtime. Try to get at least 30 minutes of sunlight every day. Morning sunlight is best. Medical concerns Work with your health care provider to treat medical conditions that may affect sleeping, such as: Nasal  obstruction. Snoring. Sleep apnea and other sleep disorders. Talk to your health care provider if you think any of your prescription medicines may cause you to have difficulty falling or staying asleep. If you have sleep problems, talk with a sleep consultant. If you think you have a sleep disorder, talk with your health care provider about getting evaluated by a specialist. Where to find more information Sleep Foundation: sleepfoundation.org American Academy of Sleep Medicine: aasm.org Centers for Disease Control and Prevention (CDC): [employer].no Contact a health care provider if: You have trouble getting to sleep or staying asleep. You often wake up very early in the morning and cannot get back to sleep. You have  daytime sleepiness. You have daytime sleep attacks of suddenly falling asleep and sudden muscle weakness (narcolepsy). You have a tingling sensation in your legs with a strong urge to move your legs (restless legs syndrome). You stop breathing briefly during sleep (sleep apnea). You think you have a sleep disorder or are taking a medicine that is affecting your quality of sleep. Summary Most adults need 7-8 hours of quality sleep each night. Getting enough quality sleep is important for your mental and physical health. Make your bedroom a place that promotes quality sleep, and avoid things that may cause you to have poor sleep, such as alcohol, caffeine, smoking, or large meals. Talk to your health care provider if you have trouble falling asleep or staying asleep. This information is not intended to replace advice given to you by your health care provider. Make sure you discuss any questions you have with your health care provider. Document Revised: 09/17/2021 Document Reviewed: 09/17/2021 Elsevier Patient Education  2024 [employer].Insomnia Insomnia is a sleep disorder that makes it difficult to fall asleep or stay asleep. Insomnia can cause fatigue, low energy, difficulty concentrating, mood swings, and poor performance at work or school. There are three different ways to classify insomnia: Difficulty falling asleep. Difficulty staying asleep. Waking up too early in the morning. Any type of insomnia can be long-term (chronic) or short-term (acute). Both are common. Short-term insomnia usually lasts for 3 months or less. Chronic insomnia occurs at least three times a week for longer than 3 months. What are the causes? Insomnia may be caused by another condition, situation, or substance, such as: Having certain mental health conditions, such as anxiety and depression. Using caffeine, alcohol, tobacco, or drugs. Having gastrointestinal conditions, such as gastroesophageal reflux disease  (GERD). Having certain medical conditions. These include: Asthma. Alzheimer's disease. Stroke. Chronic pain. An overactive thyroid gland (hyperthyroidism). Other sleep disorders, such as restless legs syndrome and sleep apnea. Menopause. Sometimes, the cause of insomnia may not be known. What increases the risk? Risk factors for insomnia include: Gender. Females are affected more often than males. Age. Insomnia is more common as people get older. Stress and certain medical and mental health conditions. Lack of exercise. Having an irregular work schedule. This may include working night shifts and traveling between different time zones. What are the signs or symptoms? If you have insomnia, the main symptom is having trouble falling asleep or having trouble staying asleep. This may lead to other symptoms, such as: Feeling tired or having low energy. Feeling nervous about going to sleep. Not feeling rested in the morning. Having trouble concentrating. Feeling irritable, anxious, or depressed. How is this diagnosed? This condition may be diagnosed based on: Your symptoms and medical history. Your health care provider may ask about: Your sleep habits. Any medical conditions you have. Your  mental health. A physical exam. How is this treated? Treatment for insomnia depends on the cause. Treatment may focus on treating an underlying condition that is causing the insomnia. Treatment may also include: Medicines to help you sleep. Counseling or therapy. Lifestyle adjustments to help you sleep better. Follow these instructions at home: Eating and drinking  Limit or avoid alcohol, caffeinated beverages, and products that contain nicotine and tobacco, especially close to bedtime. These can disrupt your sleep. Do not eat a large meal or eat spicy foods right before bedtime. This can lead to digestive discomfort that can make it hard for you to sleep. Sleep habits  Keep a sleep diary to  help you and your health care provider figure out what could be causing your insomnia. Write down: When you sleep. When you wake up during the night. How well you sleep and how rested you feel the next day. Any side effects of medicines you are taking. What you eat and drink. Make your bedroom a dark, comfortable place where it is easy to fall asleep. Put up shades or blackout curtains to block light from outside. Use a white noise machine to block noise. Keep the temperature cool. Limit screen use before bedtime. This includes: Not watching TV. Not using your smartphone, tablet, or computer. Stick to a routine that includes going to bed and waking up at the same times every day and night. This can help you fall asleep faster. Consider making a quiet activity, such as reading, part of your nighttime routine. Try to avoid taking naps during the day so that you sleep better at night. Get out of bed if you are still awake after 15 minutes of trying to sleep. Keep the lights down, but try reading or doing a quiet activity. When you feel sleepy, go back to bed. General instructions Take over-the-counter and prescription medicines only as told by your health care provider. Exercise regularly as told by your health care provider. However, avoid exercising in the hours right before bedtime. Use relaxation techniques to manage stress. Ask your health care provider to suggest some techniques that may work well for you. These may include: Breathing exercises. Routines to release muscle tension. Visualizing peaceful scenes. Make sure that you drive carefully. Do not drive if you feel very sleepy. Keep all follow-up visits. This is important. Contact a health care provider if: You are tired throughout the day. You have trouble in your daily routine due to sleepiness. You continue to have sleep problems, or your sleep problems get worse. Get help right away if: You have thoughts about hurting  yourself or someone else. Get help right away if you feel like you may hurt yourself or others, or have thoughts about taking your own life. Go to your nearest emergency room or: Call 911. Call the National Suicide Prevention Lifeline at 469-698-0574 or 988. This is open 24 hours a day. Text the Crisis Text Line at 715-857-0580. Summary Insomnia is a sleep disorder that makes it difficult to fall asleep or stay asleep. Insomnia can be long-term (chronic) or short-term (acute). Treatment for insomnia depends on the cause. Treatment may focus on treating an underlying condition that is causing the insomnia. Keep a sleep diary to help you and your health care provider figure out what could be causing your insomnia. This information is not intended to replace advice given to you by your health care provider. Make sure you discuss any questions you have with your health care provider. Document Revised:  05/05/2021 Document Reviewed: 05/05/2021 Elsevier Patient Education  2024 [employer].

## 2023-11-04 NOTE — Progress Notes (Signed)
 SLEEP MEDICINE CLINIC    Provider:  Neomia Banner, MD  Primary Care Physician:  Achille Ache, MD [address] [facility] [place] [address]     Referring Provider: [address], Baltazar Bonier, Np [address] [place],  [place] [address]          Chief Complaint according to patient   Patient presents with:     New Patient (Initial Visit)           HISTORY OF PRESENT ILLNESS:  Nancy Tanner is a 63 y.o. female patient who is seen upon [place] referral on 11/04/2023 . She has a long standing OSA dx and sarcoidosis and used CPAP which caused complications with sarcoid, causing infections in the lungs. Then switched to an oral device and has just had a new one ordered. She used this one for 5 years.  She just saw an oral surgeon, May 9 th , an he ordered  the new device but she hasn't been tested for apnea while wearing it.   Chief concern according to patient : " My last sleep study was before 2017."     I have the pleasure of seeing Nancy Tanner 11/04/23 a right -handed female with sarcoidosis  1992 and OSA. Currently on a dental device . Her brother videoed her while sleeping and captured snoring and apnea while using the dental device. She also reports severely fragmented sleep.      Sleep relevant medical history: Pulmonary Sarcoid , sinusitis, bronchitis, DM,  OSA on dental device.  Nocturia with DM, 3-4 times, Tonsillectomy at adult age , cervical spine surgery in 2024, Ant fusion.C 4-5-6.     Family medical /sleep history: sisters with OSA, mother with OSA, brother is not dx but has the symptoms.  Late Mother had lupus, father CAD, HTN, CKD.   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 .  She lives alone. No pets. No children. Tobacco use; quit 1998   ETOH use; social events - not at home ,  Caffeine intake in form of Coffee( 1 cup in AM in cold weather ) Soda( /) Tea ( hot green tea)  no energy drinks. Exercise in form of PT, just finished.  This was for the cervical spine ( Dr Felipe Horton) .        Sleep habits are as follows: The patient's dinner time is between 5-6 PM. The patient goes to bed at 1-2 AM and continues to sleep for 2-3 hours, wakes at 4 .30,  for  bathroom break and sleeps another 2 hours between 5 and 6 AM.  The preferred sleep position is laterally, with  a pillow between the knees and for the neck , an adjustable sleep number bed.   Dreams are reportedly rare/ frequent/vivid.   The patient wakes up spontaneously. 10-11  AM is the usual rise time. She reports not feeling refreshed or restored in AM, with symptoms such as dry mouth, choking, morning headaches, and residual fatigue.  Naps are taken  at variable times, frequently after activities outside, she sleeps in a recliner - < 1 hour.  She has extended sleep into almost noon time.   Review of Systems: Out of a complete 14 system review, the patient complains of only the following symptoms, and all other reviewed systems are negative:   Poor sleep hygiene. Watching TV in  bedroom and playing games on a screen.  Fatigue, sleepiness , snoring, fragmented sleep, Insomnia, Nocturia    How likely are you to doze in the following situations: 0 = not likely, 1 = slight chance, 2 = moderate chance, 3 = high chance   Sitting and Reading? Watching Television? Sitting inactive in a public place (theater or meeting)? As a passenger in a car for an hour without a break? Lying down in the afternoon when circumstances permit? Sitting and talking to someone? Sitting quietly after lunch without alcohol? In a car, while stopped for a few minutes in traffic?   Total = 15/ 24 points   FSS endorsed at 59/ 63 points.   GDS was not answered.   Social History   Socioeconomic History   Marital status: Single    Spouse name: Not on file   Number of children: Not on file   Years of education: Not on file   Highest  education level: Not on file  Occupational History   Not on file  Tobacco Use   Smoking status: Never   Smokeless tobacco: Never  Substance and Sexual Activity   Alcohol use: No   Drug use: No   Sexual activity: Not on file  Other Topics Concern   Not on file  Social History Narrative   ** Merged History Encounter **       Social Drivers of Health   Financial Resource Strain: Not on file  Food Insecurity: Not on file  Transportation Needs: Not on file  Physical Activity: Not on file  Stress: Not on file  Social Connections: Not on file    Family History  Problem Relation Age of Onset   Sleep apnea Mother    Sleep apnea Sister     Past Medical History:  Diagnosis Date   Asthma    GERD (gastroesophageal reflux disease) t   History of sarcoidosis t    Past Surgical History:  Procedure Laterality Date   ABDOMINAL HYSTERECTOMY  09/1999   ANTERIOR FUSION CERVICAL SPINE  07/2022     Current Outpatient Medications on File Prior to Visit  Medication Sig Dispense Refill   acetaminophen  (TYLENOL ) 325 MG tablet Take 325-650 mg by mouth every 6 (six) hours as needed for mild pain, moderate pain or headache.      albuterol (PROVENTIL HFA;VENTOLIN HFA) 108 (90 BASE) MCG/ACT inhaler Inhale 2 puffs into the lungs every 6 (six) hours as needed for wheezing or shortness of breath.      aluminum-magnesium  hydroxide 200-200 MG/5ML suspension Take 30 mLs by mouth 4 (four) times daily as needed (Severe heartburn).     azelastine (ASTELIN) 0.1 % nasal spray Place 2 sprays into both nostrils 2 (two) times daily.      cetirizine (ZYRTEC) 10 MG tablet Take 10 mg by mouth daily.     cyclobenzaprine (FLEXERIL) 10 MG tablet Take 10 mg by mouth 3 (three) times daily as needed for muscle spasms.     diphenhydrAMINE (BENADRYL) 12.5 MG/5ML elixir Take 12.5 mg by mouth 4 (four) times daily as needed for allergies.     EPINEPHrine (EPIPEN 2-PAK) 0.3 mg/0.3 mL IJ SOAJ injection Inject 0.3 mg into  the muscle once.     famotidine (PEPCID) 20 MG tablet Take 20 mg by mouth daily.     FLUoxetine (PROZAC) 10 MG tablet Take 30-40 mg by mouth daily.     HYDROcodone -acetaminophen  (NORCO/VICODIN) 5-325 MG per tablet Take 1 tablet by mouth every 4 (four)  hours as needed for pain. 20 tablet 0   hydrocortisone cream 1 % Apply 1 application topically 2 (two) times daily as needed for itching.      hydrOXYzine (ATARAX/VISTARIL) 25 MG tablet Take 25 mg by mouth 3 (three) times daily as needed for itching.     Hypromellose (ARTIFICIAL TEARS OP) Place 1 drop into both eyes 2 (two) times daily.     ibuprofen  (ADVIL ,MOTRIN ) 600 MG tablet Take 1 tablet (600 mg total) by mouth every 6 (six) hours as needed for moderate pain. 30 tablet 0   ketotifen (ZADITOR) 0.025 % ophthalmic solution Place 1 drop into both eyes 2 (two) times daily.     methocarbamol  (ROBAXIN ) 500 MG tablet Take 2 tablets (1,000 mg total) by mouth every 8 (eight) hours as needed for muscle spasms. 30 tablet 0   NIFEdipine (PROCARDIA-XL/ADALAT CC) 30 MG 24 hr tablet Take 30 mg by mouth daily.     omeprazole (PRILOSEC) 20 MG capsule Take 20 mg by mouth daily.     No current facility-administered medications on file prior to visit.    Allergies  Allergen Reactions   Norvasc [Amlodipine Besylate] Hives   Shellfish Allergy Anaphylaxis   Sulfa Antibiotics Hives   Codeine Hives   Egg-Derived Products Nausea And Vomiting   Lactose Intolerance (Gi) Nausea And Vomiting   Tape Rash     DIAGNOSTIC DATA (LABS, IMAGING, TESTING) - I reviewed patient records, labs, notes, testing and imaging myself where available.  Lab Results  Component Value Date   WBC 7.9 12/19/2018   HGB 12.8 12/19/2018   HCT 39.6 12/19/2018   MCV 88.8 12/19/2018   PLT 314 12/19/2018      Component Value Date/Time   NA 141 12/19/2018 1252   K 3.7 12/19/2018 1252   CL 106 12/19/2018 1252   CO2 23 12/19/2018 1252   GLUCOSE 127 (H) 12/19/2018 1252   BUN 10  12/19/2018 1252   CREATININE 0.94 12/19/2018 1252   CALCIUM 9.3 12/19/2018 1252   PROT 7.7 06/08/2016 0127   ALBUMIN 4.7 06/08/2016 0127   AST 22 06/08/2016 0127   ALT 20 06/08/2016 0127   ALKPHOS 89 06/08/2016 0127   BILITOT 0.4 06/08/2016 0127   GFRNONAA >60 12/19/2018 1252   GFRAA >60 12/19/2018 1252   Lab Results  Component Value Date   CHOL 157 01/13/2011   HDL 46 01/13/2011   LDLCALC 101 (H) 01/13/2011   TRIG 50 01/13/2011   CHOLHDL 3.4 01/13/2011   Lab Results  Component Value Date   HGBA1C 6.3 (H) 01/12/2011   Lab Results  Component Value Date   VITAMINB12 298 01/13/2011   Lab Results  Component Value Date   TSH 0.580 01/13/2011    PHYSICAL EXAM:  Today's Vitals   11/04/23 1302  BP: 123/84  Pulse: 99  Weight: 185 lb (83.9 kg)  Height: 5\' 6"  (1.676 m)   Body mass index is 29.86 kg/m.   Wt Readings from Last 3 Encounters:  11/04/23 185 lb (83.9 kg)  12/19/18 180 lb (81.6 kg)  06/08/16 185 lb (83.9 kg)     Ht Readings from Last 3 Encounters:  11/04/23 5\' 6"  (1.676 m)  12/19/18 5\' 6"  (1.676 m)  06/08/16 5\' 6"  (1.676 m)      General: The patient is awake, alert and appears not in acute distress. The patient is well groomed. Head: Normocephalic, atraumatic.  Neck is supple. Mallampati 2,  neck circumference:15.5 inches . Nasal airflow fully patent.  Retrognathia is not seen.  Dental status: biological  Cardiovascular:  Regular rate and cardiac rhythm by pulse,  without distended neck veins. Respiratory: Lungs are clear to auscultation.  Skin:  Without evidence of ankle edema, or rash. Trunk: The patient's posture is erect.   NEUROLOGIC EXAM: The patient is awake and alert, oriented to place and time.   Memory subjective described as intact.  Attention span & concentration ability appears normal.  Speech is fluent,  without  dysarthria, dysphonia or aphasia.  Mood and affect are appropriate.   Cranial nerves: no loss of smell or taste  reported  Pupils are equal and briskly reactive to light. Funduscopic exam deferred..  Extraocular movements in vertical and horizontal planes were intact and without nystagmus. No Diplopia. Visual fields by finger perimetry are intact. Hearing was intact to soft voice and finger rubbing.    Facial sensation intact - .  Facial motor strength is symmetric and tongue and uvula move midline.  Neck ROM : rotation, tilt and flexion extension were improved since surgery  and shoulder shrug was symmetrical.     Motor exam:  Symmetric bulk, tone and ROM.   Normal tone without cog -wheeling, symmetric grip strength .   Sensory:  Fine touch,vibration were felt in both ankles.  Proprioception tested in the upper extremities was normal.   Coordination: Rapid alternating movements in the fingers/hands were of normal speed.  The Finger-to-nose maneuver was intact without evidence of ataxia, dysmetria or tremor.   Gait and station: Patient could rise unassisted from a seated position, walked without assistive device.  Stance is of normal width/ base and the patient turned with 3 steps.  Toe and heel walk were deferred.  Deep tendon reflexes: in the  upper and lower extremities are symmetrically attenuated.  and intact.  Babinski response was deferred .    ASSESSMENT AND PLAN 63 y.o. year old female  here with:  Longstanding history of OSA, dx probably 10 years ago,  she recalls being told she had moderate degree of apnea.  CPAP did cause complications with her  pulmonary sarcoidosis. She is prone to infections.    1) She is a dental device user now, but Video recording show her having snoring and apnea while sleeping with the dental device.    2) Suspecting that she needs more treatment than a dental device can currently provide. The extreme fatigue can be hypoxemia related .   3) she has nocturia. every morning she feels hoarse and congested. - and not refreshed.    Plan : Baseline OSA testing  with dental device in place- It would be lovely to get a second night without dental device tested, but insurance will not allow that.   Therefor she will start the night of the HST with the dental device and take it out after 4 AM .  The patient may need to return for in lab study , but is very apprehensive about PAP therapy. .    We discussed sleep hygiene improvements, setting a bedtime and off screen time, hot shower before bedtime, cool bedroom, and rise at 8 AM-  sunlight exposure , allow only one nap of 30 minutes a day.   See education addendum.    I plan to follow up either personally or through our NP within 3-5 months.   I would like to thank Achille Ache, MD and Baker Leu, Np [address] [place],  [place] [address] for allowing me to meet with  and to take care of this pleasant patient.     After spending a total time of  45  minutes face to face and additional time for physical and neurologic examination, review of laboratory studies,  personal review of imaging studies, reports and results of other testing and review of referral information / records as far as provided in visit,   Electronically signed by: Neomia Banner, MD 11/04/2023 1:04 PM  [facility] and [facility] Board certified by The [employer] of Sleep Medicine and Diplomate of the [employer] of Sleep Medicine. Board certified In Neurology through the ABPN, Fellow of the [employer] of Neurology.

## 2023-11-17 ENCOUNTER — Ambulatory Visit (INDEPENDENT_AMBULATORY_CARE_PROVIDER_SITE_OTHER): Payer: No Typology Code available for payment source | Admitting: Podiatry

## 2023-11-17 ENCOUNTER — Encounter: Payer: Self-pay | Admitting: Podiatry

## 2023-11-17 DIAGNOSIS — E1142 Type 2 diabetes mellitus with diabetic polyneuropathy: Secondary | ICD-10-CM

## 2023-11-17 NOTE — Progress Notes (Signed)
  Subjective:  Patient ID: Nancy Tanner, female    DOB: 30-Aug-1960,   MRN: [id number]  Chief Complaint  Patient presents with   Nail Problem    Rm 21 Patient is here for diabetic foot exam. Patient is concerned about right foot bunion pain. Pain described as a cramping sensation.    63 y.o. female presents for diabetic foot check  Concern for bunion pain and relates cramping in the feet. . Relates burning and tingling in their feet. Patient is diabetic and last A1c was  Lab Results  Component Value Date   HGBA1C 6.3 (H) 01/12/2011   .   PCP:  Achille Ache, MD    . Denies any other pedal complaints. Denies n/v/f/c.   Past Medical History:  Diagnosis Date   Asthma    GERD (gastroesophageal reflux disease) t   History of sarcoidosis t    Objective:  Physical Exam: Vascular: DP/PT pulses 2/4 bilateral. CFT <3 seconds. Absent hair growth on digits. Edema noted to bilateral lower extremities. Xerosis noted bilaterally.  Skin. No lacerations or abrasions bilateral feet. Nails 1-5 bilateral  are normal in appearance Musculoskeletal: MMT 5/5 bilateral lower extremities in DF, PF, Inversion and Eversion. Deceased ROM in DF of ankle joint. 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  Neurological: Sensation intact to light touch. Protective sensation diminished bilateral.    Assessment:   1. Type 2 diabetes mellitus with diabetic polyneuropathy, without long-term current use of insulin (HCC)       Plan:  Patient was evaluated and treated and all questions answered. -Discussed and educated patient on diabetic foot care, especially with  regards to the vascular, neurological and musculoskeletal systems.  -Stressed the importance of good glycemic control and the detriment of not  controlling glucose levels in relation to the foot. -Discussed supportive shoes at all times and checking feet regularly.  -Answered all patient questions -Patient to return  in 1 year for DM foot check   -Patient advised to call the office if any problems or questions arise in the meantime.   Jennefer Moats, DPM

## 2023-11-23 ENCOUNTER — Ambulatory Visit (INDEPENDENT_AMBULATORY_CARE_PROVIDER_SITE_OTHER): Admitting: Neurology

## 2023-11-23 DIAGNOSIS — G478 Other sleep disorders: Secondary | ICD-10-CM

## 2023-11-23 DIAGNOSIS — Z789 Other specified health status: Secondary | ICD-10-CM

## 2023-11-23 DIAGNOSIS — R351 Nocturia: Secondary | ICD-10-CM

## 2023-11-23 DIAGNOSIS — G4733 Obstructive sleep apnea (adult) (pediatric): Secondary | ICD-10-CM | POA: Diagnosis not present

## 2023-11-23 DIAGNOSIS — D860 Sarcoidosis of lung: Secondary | ICD-10-CM

## 2023-11-25 NOTE — Progress Notes (Signed)
 [name] D. Horlacher Female, 63 y.o., [birth year]  MRN: [id number]   HOME SLEEP TEST REPORT ( by Watch PAT)   STUDY DATE:  11-24-2023   ORDERING CLINICIAN: Dedra Gores, MD  REFERRING CLINICIAN:  [facility] GLENWOOD Calamity, MD    CLINICAL INFORMATION/HISTORY: This patient was seen on 11-04-2023 referred by Alan Retort nurse practitioner and has a history of pulmonary sarcoidosis, a longstanding history of obstructive sleep apnea but stated that previous therapy with continuous positive airway pressure caused complications with her sarcoid infections in the lungs.  She then had to switch to an oral device and just had a new one ordered.  She has used the dental device for about 5 years now.  On May 9 she had seen an oral surgeon who ordered the new device but she has not been tested for apnea by wearing it. She is a dental device user now, but Video recording show her having snoring and apnea while sleeping with the dental device.     Her very last sleep study was before 2017.  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 Sisters with OSA.     Epworth sleepiness score: 15/ 24 points   FSS endorsed at 59/ 63 points.   BMI: 29.9 kg/m   Neck Circumference: 15.5''   FINDINGS:   Sleep Summary:   Total Recording Time (hours, min): 8 hours 55 minutes      Total Sleep Time (hours, min): 7 hours 19 minutes                Percent REM (%):     23.1%   Sleep latency was 70 minutes long and REM sleep latency 87 minutes long.    Sleep architecture shows 5 cycles of REM sleep throughout the night.                               Respiratory Indices:   Calculated pAHI (per AASM or CMS guideline): Based on the AASM scoring criteria this patient would have an AHI of 28/h and be in the category of moderate to severe sleep apnea.   Applying the CMS criteria of scoring , her AHI is 12.8/h and  she would be considered mild.  She did not have any central apneas.                        REM pAHI:   13/h                                              NREM pAHI: 12.7/h                            Positional AHI: The patient slept 310 minutes in supine associated with a CMS AHI of 10.8/h and she slept 128 minutes on her left side and here her AHI was 4/h.       Snoring: Mean volume was 42 dB and snoring was present for less than 40% of total sleep time.  Oxygen Saturation Statistics:   Oxygen Saturation (%) Mean: 95%              O2 Saturation Range (%):    Between 86 and 99% saturation                                   O2 Saturation (minutes) <89%:   0.5 minutes        Pulse Rate Statistics:   Pulse Mean (bpm):     70 bpm            Pulse Range:      Between 54 and 113 bpm, without information about cardiac rhythm.           IMPRESSION:  This HST confirms the presence of mild all obstructive sleep apnea when applying the CMS criteria.  This was not REM sleep dependent apnea but overall the data indicate that neither hypoxemia nor bradycardia were present .     RECOMMENDATION: I do think that this now mild degree of obstructive apnea may well be treatable with a dental device alone.   I am glad to see that there is no hypoxia seen that could be attributed to primary pulmonary diseases.  I am hesitant to recommend return to positive airway pressure therapy given her history of frequent pulmonary infections.    However, I do think that it is worthwhile avoiding supine sleep and that this step alone may reduce the apnea more significantly than the addition of positive airway pressure.     INTERPRETING PHYSICIAN:   Dedra Gores, MD  [facility] Board certified by The [employer] of Sleep Medicine and Diplomate of the [employer] of Sleep Medicine. Board certified In Neurology through  the ABPN, Fellow of the [employer] of Neurology.                           WBC count 1

## 2023-12-16 ENCOUNTER — Ambulatory Visit: Payer: Self-pay | Admitting: Neurology

## 2023-12-16 NOTE — Procedures (Signed)
 Nancy Tanner at Nancy Tanner Female, 63 y.o., [birth year]  MRN: [id number]   HOME Tanner TEST REPORT ( by Watch PAT)   STUDY DATE:  11-24-2023   ORDERING CLINICIAN: Dedra Gores, MD  REFERRING CLINICIAN:  [facility] - provider  Bobbie, MD    CLINICAL INFORMATION/HISTORY: This [facility] patient was seen on 11-04-2023 referred by Nancy Tanner, nurse practitioner with the [facility] system. This Patient  has a history of pulmonary sarcoidosis and a longstanding history of obstructive Tanner apnea but stated that previous therapy with continuous positive airway pressure caused complications with her sarcoid infections in the lungs.   She then had to switch to an oral device and just had a new one ordered.   She has used the dental device for about 5 years now.  On May 9 she had seen an oral surgeon who ordered the new device but she has not been tested for apnea by wearing it. She is a dental device user now, but Video recording show her having snoring and apnea while sleeping with the dental device.     Her very last Tanner study was before 2017.  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 Sisters with OSA.     Epworth sleepiness score: 15/ 24 points   FSS endorsed at 59/ 63 points.   BMI: 29.9 kg/m   Neck Circumference: 15.5''   FINDINGS:   Tanner Summary:   Total Recording Time (hours, min): 8 hours 55 minutes      Total Tanner Time (hours, min): 7 hours 19 minutes                Percent REM (%):     23.1%   Tanner latency was 70 minutes long and REM Tanner latency 87 minutes long.    Tanner architecture shows 5 cycles of REM Tanner throughout the night.                               Respiratory Indices:   Calculated pAHI (per AASM or CMS guideline): Based on the AASM scoring criteria this patient would have an AHI of 28/h and be in the category of moderate to severe Tanner apnea.   Applying the  CMS criteria of scoring , her AHI is 12.8/h and she would be considered mild.  She did not have any central apneas.                        REM pAHI:   13/h                                              NREM pAHI: 12.7/h                            Positional AHI: The patient slept 310 minutes in supine associated with a CMS AHI of 10.8/h and she slept 128 minutes on her left side and here her AHI was 4/h.       Snoring: Mean volume was 42 dB and snoring was present for less than 40% of total Tanner time.  Oxygen Saturation Statistics:   Oxygen Saturation (%) Mean: 95%              O2 Saturation Range (%):    Between 86 and 99% saturation                                   O2 Saturation (minutes) <89%:   0.5 minutes        Pulse Rate Statistics:   Pulse Mean (bpm):     70 bpm            Pulse Range:      Between 54 and 113 bpm, without information about cardiac rhythm.           IMPRESSION:  This HST confirms the presence of mild all obstructive Tanner apnea when applying the CMS criteria.  This was not REM Tanner dependent apnea but overall the data indicate that neither hypoxemia nor bradycardia were present .     RECOMMENDATION: I do think that this now mild degree of obstructive apnea may well be treatable with a dental device alone.   I am glad to see that there is no hypoxia seen that could be attributed to primary pulmonary diseases.  I am hesitant to recommend return to positive airway pressure therapy given her history of frequent pulmonary infections.    However, I do think that it is worthwhile avoiding supine Tanner and that this step alone may reduce the apnea more significantly than the addition of positive airway pressure.     INTERPRETING PHYSICIAN:   Nancy Gores, MD  [facility] and [facility] Board certified by The [employer] of Tanner Medicine and Diplomate of the [employer] of Tanner  Medicine. Board certified In Neurology through the ABPN, Fellow of the [employer] of Neurology.

## 2023-12-22 NOTE — Telephone Encounter (Signed)
 Spoke w/Pt regarding HST results. Pt stated she had received the results via mail, was pleased with results and had no questions. Pt stated she received her dental device and is waiting on a call from the company for f/u but [facility] since she has not yet heard from company. Pt due for f/u visit with NP in 5-6 mos from appt in May 2025. Able to schedule Pt for [facility] with NP for 04/17/24. Pt voiced understanding and thanks for the call.

## 2023-12-22 NOTE — Telephone Encounter (Signed)
-----   Message from [place] Dohmeier sent at 12/16/2023  4:27 PM EDT ----- By CMS scoring criteria this patient now has mild  and all obstructive sleep apnea, which is  not REM sleep dependent and not associated with Hypoxia nor bradycardia.   There was a significant positional component. I do think that this now mild degree of obstructive apnea may well be treatable with a dental device alone.   I am glad to see that there is no hypoxia seen that could be attributed to primary pulmonary diseases.   I am hesitant to recommend return to positive airway pressure therapy given her history of frequent pulmonary infections.    However, I do think that it is worthwhile avoiding supine sleep and that this step alone may reduce the apnea more significantly than the addition of positive airway pressure.  Note to [place] provider and follow up through [place]-       ----- Message ----- From: Chalice Saunas, MD Sent: 12/16/2023   4:24 PM EDT To: Saunas Chalice, MD

## 2024-04-17 ENCOUNTER — Encounter: Payer: Self-pay | Admitting: Adult Health

## 2024-04-17 ENCOUNTER — Telehealth: Admitting: Adult Health

## 2024-04-17 DIAGNOSIS — G4733 Obstructive sleep apnea (adult) (pediatric): Secondary | ICD-10-CM

## 2024-04-17 DIAGNOSIS — D860 Sarcoidosis of lung: Secondary | ICD-10-CM

## 2024-04-17 NOTE — Progress Notes (Signed)
 [facility] [address] [place]. [place] [address] [phone]       OFFICE FOLLOW UP NOTE  [name] Date of Birth:  14-Jul-1960 Medical Record Number:  [id number]    Primary neurologist: Dr. Chalice Reason for visit: OSA    Virtual Visit via Video Note  I connected with Nancy Tanner on 04/17/24 at  2:15 PM EST by a video enabled telemedicine application and verified that I am speaking with the correct person using two identifiers.  Location: Patient: at home Provider: in office, [facility]   I discussed the limitations of evaluation and management by telemedicine and the availability of in person appointments. The patient expressed understanding and agreed to proceed.     SUBJECTIVE:   Follow-up visit:  Prior visit: 11/04/2023 with Dr. Chalice  Brief HPI:   Nancy Tanner is a 63 y.o. female who was evaluated by Dr. Chalice in 10/2023 with prior diagnosis of OSA around 2017, use of CPAP caused complications with sarcoidosis therefore transitioned to oral appliance. She was due to obtain new oral appliance, family noting snoring and apneas despite use, never had repeat sleep study with use of device.  Recommended pursuing repeat sleep study with use of oral appliance but to remove several hours upon awakening (insurance would not approve sleep study without use of appliance). Completed HST 11/2023 which showed overall mild OSA without evidence of hypoxia or bradycardia, unclear based on report if used device throughout entire duration of sleep.  Recommended continued treatment with dental device, hesitant to recommend CPAP given frequent pulmonary infections.      Interval history:  Returns today for follow-up visit. When she completed above HST, she was using older appliance, used for half of the duration. She has since obtained a new oral appliance which she reports using nightly.  She has had 2 adjustments since receiving and has since noticed  improvement of daytime energy levels, sleep quality and no longer snoring.  At times can be uncomfortable as she grinds her teeth but otherwise no issues with appliance.         ROS:   14 system review of systems performed and negative with exception of those listed in HPI  PMH:  Past Medical History:  Diagnosis Date   Asthma    GERD (gastroesophageal reflux disease) t   History of sarcoidosis t    PSH:  Past Surgical History:  Procedure Laterality Date   ABDOMINAL HYSTERECTOMY  09/1999   ANTERIOR FUSION CERVICAL SPINE  07/2022    Social History:  Social History   Socioeconomic History   Marital status: Single    Spouse name: Not on file   Number of children: Not on file   Years of education: Not on file   Highest education level: Not on file  Occupational History   Not on file  Tobacco Use   Smoking status: Never   Smokeless tobacco: Never  Substance and Sexual Activity   Alcohol use: No   Drug use: No   Sexual activity: Not on file  Other Topics Concern   Not on file  Social History Narrative   ** Merged History Encounter **       Social Drivers of Health   Financial Resource Strain: Not on file  Food Insecurity: Not on file  Transportation Needs: Not on file  Physical Activity: Not on file  Stress: Not on file  Social Connections: Not on file  Intimate Partner Violence: Not on file  Family History:  Family History  Problem Relation Age of Onset   Sleep apnea Mother    Sleep apnea Sister     Medications:   Current Outpatient Medications on File Prior to Visit  Medication Sig Dispense Refill   acetaminophen  (TYLENOL ) 325 MG tablet Take 325-650 mg by mouth every 6 (six) hours as needed for mild pain, moderate pain or headache.      albuterol (PROVENTIL HFA;VENTOLIN HFA) 108 (90 BASE) MCG/ACT inhaler Inhale 2 puffs into the lungs every 6 (six) hours as needed for wheezing or shortness of breath.      ALPRAZolam  (XANAX ) 0.5 MG tablet Take 1  tablet (0.5 mg total) by mouth at bedtime as needed for anxiety. 2 tablet 0   aluminum-magnesium  hydroxide 200-200 MG/5ML suspension Take 30 mLs by mouth 4 (four) times daily as needed (Severe heartburn).     azelastine (ASTELIN) 0.1 % nasal spray Place 2 sprays into both nostrils 2 (two) times daily.      cetirizine (ZYRTEC) 10 MG tablet Take 10 mg by mouth daily.     cyclobenzaprine (FLEXERIL) 10 MG tablet Take 10 mg by mouth 3 (three) times daily as needed for muscle spasms.     diphenhydrAMINE (BENADRYL) 12.5 MG/5ML elixir Take 12.5 mg by mouth 4 (four) times daily as needed for allergies.     EPINEPHrine (EPIPEN 2-PAK) 0.3 mg/0.3 mL IJ SOAJ injection Inject 0.3 mg into the muscle once.     famotidine (PEPCID) 20 MG tablet Take 20 mg by mouth daily.     FLUoxetine (PROZAC) 10 MG tablet Take 30-40 mg by mouth daily.     hydrocortisone cream 1 % Apply 1 application topically 2 (two) times daily as needed for itching.      hydrOXYzine (ATARAX/VISTARIL) 25 MG tablet Take 25 mg by mouth 3 (three) times daily as needed for itching.     Hypromellose (ARTIFICIAL TEARS OP) Place 1 drop into both eyes 2 (two) times daily.     ibuprofen  (ADVIL ,MOTRIN ) 600 MG tablet Take 1 tablet (600 mg total) by mouth every 6 (six) hours as needed for moderate pain. 30 tablet 0   ketotifen (ZADITOR) 0.025 % ophthalmic solution Place 1 drop into both eyes 2 (two) times daily.     methocarbamol  (ROBAXIN ) 500 MG tablet Take 2 tablets (1,000 mg total) by mouth every 8 (eight) hours as needed for muscle spasms. 30 tablet 0   NIFEdipine (PROCARDIA-XL/ADALAT CC) 30 MG 24 hr tablet Take 30 mg by mouth daily.     omeprazole (PRILOSEC) 20 MG capsule Take 20 mg by mouth daily.     No current facility-administered medications on file prior to visit.    Allergies:   Allergies  Allergen Reactions   Norvasc [Amlodipine Besylate] Hives   Shellfish Allergy Anaphylaxis   Sulfa Antibiotics Hives   Codeine Hives   Egg  Protein-Containing Drug Products Nausea And Vomiting   Lactose Intolerance (Gi) Nausea And Vomiting   Tape Rash      OBJECTIVE:  Physical Exam   General: well developed, well nourished, seated, in no evident distress  Neurologic Exam Mental Status: Awake and fully alert. Oriented to place and time. Recent and remote memory intact. Attention span, concentration and fund of knowledge appropriate. Mood and affect appropriate.         ASSESSMENT/PLAN: Nancy Tanner is a 63 y.o. year old female      OSA :  Continue use of oral appliance nightly Suspect good treatment with  apnea with improved sleep quality, daytime energy levels and no longer snoring. Will discuss need of repeat HST in the future with sleep manager and keep patient updated via mychart Repeat sleep study showed overall mild apnea with use of oral appliance Not a good candidate for CPAP therapy due to prior complications with sarcoidosis and frequent infections on CPAP        CC:  PCP: Bobbie Vicenta BROCKS, MD    I personally spent a total of 15 minutes in the care of the patient today including preparing to see the patient, counseling and educating, and documenting clinical information in the EHR.  Harlene Bogaert, AGNP-BC  [facility] [address] [place], [place] [address]  Phone [phone] Fax [phone] Note: This document was prepared with digital dictation and possible smart phrase technology. Any transcriptional errors that result from this process are unintentional.

## 2024-04-19 ENCOUNTER — Encounter: Payer: Self-pay | Admitting: Adult Health

## 2024-04-19 DIAGNOSIS — G4733 Obstructive sleep apnea (adult) (pediatric): Secondary | ICD-10-CM

## 2024-04-24 NOTE — Telephone Encounter (Signed)
 Is something you can help follow up on? This is a patient with use of oral appliance. Recent sleep study was completed with old oral appliance. Discussed with Meagan who recommended repeat sleep study with new appliance but will need auth from [place]. Can I place the order now for repeat HST or do we need to wait until we have the authorization?

## 2024-06-13 ENCOUNTER — Encounter: Payer: Self-pay | Admitting: Adult Health

## 2024-06-16 ENCOUNTER — Ambulatory Visit: Admitting: Neurology

## 2024-06-16 DIAGNOSIS — Z789 Other specified health status: Secondary | ICD-10-CM

## 2024-06-16 DIAGNOSIS — D860 Sarcoidosis of lung: Secondary | ICD-10-CM

## 2024-06-16 DIAGNOSIS — G4733 Obstructive sleep apnea (adult) (pediatric): Secondary | ICD-10-CM

## 2024-06-16 NOTE — Telephone Encounter (Signed)
 Patient is scheduled for the [facility] HST for today 06/16/2024

## 2024-06-28 NOTE — Progress Notes (Signed)
 "      [name]   HOME SLEEP TEST REPORT ( by Elene  mail -out device )    Nancy Tanner    The [facility] chest-worn sensor is an FDA cleared and DOT approved type 4 home sleep test device - it measures eight physiological channels,  including blood oxygen saturation (measured via PPG [photoplethysmography]), EKG-derived heart rate, respiratory effort, chest movement (measured via accelerometer), snoring, body position, and actigraphy.  STUDY DATE:  06-20-2024  Data received :  06-28-2024    ORDERING CLINICIAN:  Whitfield, NP   REFERRING CLINICIAN:[facility]  Everhart, Alan JONETTA, [address] [place],  [place] [address]    CLINICAL INFORMATION/HISTORY:   Nancy Tanner is a 64 y.o. female who was evaluated by Dr. Chalice in 10/2023 with prior diagnosis of OSA around 2017, use of CPAP caused complications with sarcoidosis therefore transitioned to oral appliance. She was due to obtain new oral appliance, family noting snoring and apneas despite use, never had repeat sleep study with use of device.  Recommended pursuing repeat sleep study with use of oral appliance but to remove several hours upon awakening (insurance would not approve sleep study without use of appliance). Completed HST 11/2023 which showed overall mild OSA without evidence of hypoxia or bradycardia, unclear based on report if used device throughout entire duration of sleep.  Recommended continued treatment with dental device, hesitant to recommend CPAP given frequent pulmonary infections.   Here referred for a new HST following  a HST on 11-24-2023, which showed the following results : Applying the CMS criteria of scoring , her AHI is 12.8/h and she would be considered mild.  She did not have any central apneas.                     REM pAHI:   13/h                             NREM pAHI: 12.7/h                          Positional AHI: The patient slept 310 minutes in supine associated with a CMS AHI of 10.8/h and she slept 128 minutes on her  left side and here her AHI was 4/h. No REM dependency was noted by watch pat device.    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 This Patient  has a history of pulmonary sarcoidosis and a longstanding history of obstructive sleep apnea but stated that previous therapy with continuous positive airway pressure caused complications with her sarcoid infections in the lungs.   She then had to switch to an oral device and just had a new one ordered.   She has used the dental device for about 5 years now.  On May 9 she had seen an oral surgeon who ordered the new device but she has not been tested for apnea by wearing it. She is a dental device user now, but Video recording show her having snoring and apnea while sleeping with the dental device.  Her very last sleep study was before 2017.  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 Sisters with OSA. Epworth sleepiness score: 15/ 24 points  FSS endorsed at 59/ 63 points. I have the pleasure of seeing Nancy Tanner 11/04/23 a right -handed female with sarcoidosis  1992 and OSA. Currently on a dental device . Her brother videoed her while sleeping and captured snoring and apnea while using the dental device. She also reports severely fragmented sleep.   Sleep relevant medical history: Pulmonary Sarcoidosis , sinusitis, bronchitis, DM,   OSA on dental device.   Nocturia with DM, 3-4 times,  Tonsillectomy at adult age ,  cervical spine surgery in 2024 Ant fusion.C 4-5-6.     Epworth sleepiness score:  from May 2025: 15/ 24 points  FSS endorsed at 59/ 63 points. BMI:  29.8 kg/m Neck Circumference: 15.5'    Sleep Summary:   Start  Recording Time (hours, min):   06-20-2024 at 19: 06 hours      Total Sleep Time (hours, min):   5 h 41 minutes   Sleep efficiency %; 58%   REM sleep : 1 h 39  minutes                                        Respiratory Indices ( AHI )  by CMS criteria of scoring;    Calculated pAHI (per hour):     8.1/h   ( AASM -AHI was 13.2/h)                                          Positional  AHI :  sleep was mostly recorded in supine position, AHI 8.6/h   REM sleep AHI : 30.7/h   Oxygen Saturation during Sleep:   Oxygen Saturation (%) Mean at 94  % with an  O2 Saturation Range (%) between 60 and 99.9  %                                        O2 Saturation time (minutes) <89%: 3 minutes       Pulse Rate during Sleep :   Pulse Mean (bpm)  74 bpm  in  rhythm,  Pulse Range between 63  bpm and  95  bpm.,                 IMPRESSION:  This HST confirms the presence of  mild obstructive sleep apnea, but in contrast to the watch pat study  from last summer, this time with strong REM sleep dependency.  If this study was performed with a dental device in place,  the reduction of apnea overall is encouraging .  Please compare to previous HST with AHI 12.7/h. to now 8.1/h.   RECOMMENDATION:  If this study was performed with a dental device in place, reduction of apnea overall is encouraging .  The AHI can be further  improved by avoiding sleeping on the back or by sleeping with an elevated head of bed. CPAP is optional for mild sleep apnea patients.    Any Patient endorsing a high level of sleepiness should be cautioned not to drive, work at heights, or operate dangerous machinery or heavy equipment when tired or sleepy.  Review of good sleep hygiene measures took place in the initial consultation but should be revisited ( Your guide  to better sleep  a publication by the NIH is a good source of information).   The referring provider will be notified of the test results.    I certify that I have reviewed the raw data recording prior to the issuance of this report in accordance with the standards of the American Academy of Sleep Medicine (AASM).    INTERPRETING  PHYSICIAN: Dedra Gores, MD  [facility] and [facility] Sleep Board certified by The [employer] of Sleep Medicine and Diplomate of the [employer] of Sleep Medicine. Board certified In Neurology through the ABPN, Fellow of the [employer] of Neurology.           "

## 2024-07-05 ENCOUNTER — Ambulatory Visit: Payer: Self-pay | Admitting: Adult Health

## 2024-07-05 NOTE — Telephone Encounter (Signed)
 To answer your question

## 2024-07-05 NOTE — Procedures (Signed)
 "      [name]   HOME SLEEP TEST REPORT ( by Elene  mail -out device )    Nancy Tanner    The [facility] chest-worn sensor is an FDA cleared and DOT approved type 4 home sleep test device - it measures eight physiological channels,  including blood oxygen saturation (measured via PPG [photoplethysmography]), EKG-derived heart rate, respiratory effort, chest movement (measured via accelerometer), snoring, body position, and actigraphy.  STUDY DATE:  06-20-2024  Data received :  06-28-2024    ORDERING CLINICIAN:  Whitfield, Nancy   REFERRING CLINICIAN:[facility]  Nancy Tanner, Nancy Tanner, Nancy Tanner,  Nancy Tanner    CLINICAL INFORMATION/HISTORY:   Nancy Tanner is a 64 y.o. female who was evaluated by Dr. Chalice in 10/2023 with prior diagnosis of OSA around 2017, use of CPAP caused complications with sarcoidosis therefore transitioned to oral appliance. She was due to obtain new oral appliance, family noting snoring and apneas despite use, never had repeat sleep study with use of device.  Recommended pursuing repeat sleep study with use of oral appliance but to remove several hours upon awakening (insurance would not approve sleep study without use of appliance). Completed HST 11/2023 which showed overall mild OSA without evidence of hypoxia or bradycardia, unclear based on report if used device throughout entire duration of sleep.  Recommended continued treatment with dental device, hesitant to recommend CPAP given frequent pulmonary infections.   Here referred for a new HST following  a HST on 11-24-2023, which showed the following results : Applying the CMS criteria of scoring , her AHI is 12.8/h and she would be considered mild.  She did not have any central apneas.                     REM pAHI:   13/h                             NREM pAHI: 12.7/h                          Positional AHI: The patient slept 310 minutes in supine associated with a CMS AHI of 10.8/h and she slept 128 minutes on her  left side and here her AHI was 4/h. No REM dependency was noted by watch pat device.    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 This Patient  has a history of pulmonary sarcoidosis and a longstanding history of obstructive sleep apnea but stated that previous therapy with continuous positive airway pressure caused complications with her sarcoid infections in the lungs.   She then had to switch to an oral device and just had a new one ordered.   She has used the dental device for about 5 years now.  On May 9 she had seen an oral surgeon who ordered the new device but she has not been tested for apnea by wearing it. She is a dental device user now, but Video recording show her having snoring and apnea while sleeping with the dental device.  Her very last sleep study was before 2017.  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 Sisters with OSA. Epworth sleepiness score: 15/ 24 points  FSS endorsed at 59/ 63 points. I have the pleasure of seeing Nancy Tanner 11/04/23 a right -handed female with sarcoidosis  1992 and OSA. Currently on a dental device . Her brother videoed her while sleeping and captured snoring and apnea while using the dental device. She also reports severely fragmented sleep.   Sleep relevant medical history: Pulmonary Sarcoidosis , sinusitis, bronchitis, DM,   OSA on dental device.   Nocturia with DM, 3-4 times,  Tonsillectomy at adult age ,  cervical spine surgery in 2024 Ant fusion.C 4-5-6.     Epworth sleepiness score:  from May 2025: 15/ 24 points  FSS endorsed at 59/ 63 points. BMI:  29.8 kg/m Neck Circumference: 15.5'    Sleep Summary:   Start  Recording Time (hours, min):   06-20-2024 at 19: 06 hours      Total Sleep Time (hours, min):   5 h 41 minutes   Sleep efficiency %; 58%   REM sleep : 1 h 39  minutes                                        Respiratory Indices ( AHI )  by CMS criteria of scoring;    Calculated pAHI (per hour):     8.1/h   ( AASM -AHI was 13.2/h)                                          Positional  AHI :  sleep was mostly recorded in supine position, AHI 8.6/h   REM sleep AHI : 30.7/h   Oxygen Saturation during Sleep:   Oxygen Saturation (%) Mean at 94  % with an  O2 Saturation Range (%) between 60 and 99.9  %                                        O2 Saturation time (minutes) <89%: 3 minutes       Pulse Rate during Sleep :   Pulse Mean (bpm)  74 bpm  in  rhythm,  Pulse Range between 63  bpm and  95  bpm.,                 IMPRESSION:  This HST confirms the presence of  mild obstructive sleep apnea, but in contrast to the watch pat study  from last summer, this time with strong REM sleep dependency.  If this study was performed with a dental device in place,  the reduction of apnea overall is encouraging .  Please compare to previous HST with AHI 12.7/h. to now 8.1/h.   RECOMMENDATION:  If this study was performed with a dental device in place, reduction of apnea overall is encouraging .  The AHI can be further  improved by avoiding sleeping on the back or by sleeping with an elevated head of bed. CPAP is optional for mild sleep apnea patients.    Any Patient endorsing a high level of sleepiness should be cautioned not to drive, work at heights, or operate dangerous machinery or heavy equipment when tired or sleepy.  Review of good sleep hygiene measures took place in the initial consultation but should be revisited ( Your guide  to better sleep  a publication by the NIH is a good source of information).   The referring provider will be notified of the test results.    I certify that I have reviewed the raw data recording prior to the issuance of this report in accordance with the standards of the American Academy of Sleep Medicine (AASM).    INTERPRETING  PHYSICIAN: Dedra Gores, MD  [facility] and [facility] Sleep Board certified by The [employer] of Sleep Medicine and Diplomate of the [employer] of Sleep Medicine. Board certified In Neurology through the ABPN, Fellow of the [employer] Neu[employer].           "
